# Patient Record
Sex: Female | Born: 1975 | Hispanic: Yes | Marital: Married | Smoking: Never smoker
Health system: Southern US, Community
[De-identification: ages and names within clinical notes are randomized; demographics above are authoritative.]

## PROBLEM LIST (undated history)

## (undated) DIAGNOSIS — N946 Dysmenorrhea, unspecified: Secondary | ICD-10-CM

## (undated) DIAGNOSIS — R51 Headache: Secondary | ICD-10-CM

## (undated) DIAGNOSIS — B159 Hepatitis A without hepatic coma: Secondary | ICD-10-CM

## (undated) DIAGNOSIS — M5412 Radiculopathy, cervical region: Secondary | ICD-10-CM

## (undated) DIAGNOSIS — B019 Varicella without complication: Secondary | ICD-10-CM

## (undated) DIAGNOSIS — K5900 Constipation, unspecified: Secondary | ICD-10-CM

## (undated) DIAGNOSIS — M67441 Ganglion, right hand: Secondary | ICD-10-CM

## (undated) DIAGNOSIS — R768 Other specified abnormal immunological findings in serum: Secondary | ICD-10-CM

## (undated) DIAGNOSIS — N809 Endometriosis, unspecified: Secondary | ICD-10-CM

## (undated) DIAGNOSIS — E559 Vitamin D deficiency, unspecified: Secondary | ICD-10-CM

## (undated) DIAGNOSIS — R519 Headache, unspecified: Secondary | ICD-10-CM

## (undated) DIAGNOSIS — E785 Hyperlipidemia, unspecified: Secondary | ICD-10-CM

## (undated) DIAGNOSIS — M47812 Spondylosis without myelopathy or radiculopathy, cervical region: Secondary | ICD-10-CM

## (undated) DIAGNOSIS — M722 Plantar fascial fibromatosis: Secondary | ICD-10-CM

## (undated) DIAGNOSIS — I959 Hypotension, unspecified: Secondary | ICD-10-CM

## (undated) DIAGNOSIS — R590 Localized enlarged lymph nodes: Secondary | ICD-10-CM

## (undated) DIAGNOSIS — E539 Vitamin B deficiency, unspecified: Secondary | ICD-10-CM

## (undated) HISTORY — DX: Hypotension, unspecified: I95.9

## (undated) HISTORY — DX: Vitamin D deficiency, unspecified: E55.9

## (undated) HISTORY — DX: Varicella without complication: B01.9

## (undated) HISTORY — DX: Radiculopathy, cervical region: M54.12

## (undated) HISTORY — DX: Hyperlipidemia, unspecified: E78.5

## (undated) HISTORY — DX: Headache, unspecified: R51.9

## (undated) HISTORY — DX: Endometriosis, unspecified: N80.9

## (undated) HISTORY — DX: Vitamin B deficiency, unspecified: E53.9

## (undated) HISTORY — DX: Dysmenorrhea, unspecified: N94.6

## (undated) HISTORY — DX: Hepatitis a without hepatic coma: B15.9

## (undated) HISTORY — DX: Plantar fascial fibromatosis: M72.2

## (undated) HISTORY — DX: Localized enlarged lymph nodes: R59.0

## (undated) HISTORY — DX: Ganglion, right hand: M67.441

## (undated) HISTORY — PX: DILATION AND CURETTAGE OF UTERUS: SHX78

## (undated) HISTORY — DX: Other specified abnormal immunological findings in serum: R76.8

## (undated) HISTORY — DX: Headache: R51

## (undated) HISTORY — DX: Spondylosis without myelopathy or radiculopathy, cervical region: M47.812

## (undated) HISTORY — DX: Constipation, unspecified: K59.00

---

## 2004-07-04 HISTORY — PX: TUBAL LIGATION: SHX77

## 2006-05-04 HISTORY — PX: ENDOMETRIAL ABLATION: SHX621

## 2006-09-22 LAB — HIV ANTIBODY (ROUTINE TESTING W REFLEX): HIV 1&2 Ab, 4th Generation: NEGATIVE

## 2014-11-22 DIAGNOSIS — M67441 Ganglion, right hand: Secondary | ICD-10-CM

## 2014-11-22 HISTORY — DX: Ganglion, right hand: M67.441

## 2014-12-02 LAB — HM MAMMOGRAPHY

## 2015-03-02 DIAGNOSIS — R768 Other specified abnormal immunological findings in serum: Secondary | ICD-10-CM

## 2015-03-02 HISTORY — DX: Other specified abnormal immunological findings in serum: R76.8

## 2015-03-02 LAB — HEMOGLOBIN A1C: Hemoglobin A1C: 4.8

## 2015-03-02 LAB — BASIC METABOLIC PANEL
BUN: 13 (ref 4–21)
Creatinine: 0.8 (ref 0.5–1.1)
GLUCOSE: 82
POTASSIUM: 4.3 (ref 3.4–5.3)
SODIUM: 137 (ref 137–147)

## 2015-03-02 LAB — TSH: TSH: 2.3 (ref 0.41–5.90)

## 2015-03-02 LAB — VITAMIN B12: Vitamin B-12: 219

## 2015-03-02 LAB — CBC AND DIFFERENTIAL
HCT: 44 (ref 36–46)
Hemoglobin: 14.8 (ref 12.0–16.0)
Neutrophils Absolute: 5
Platelets: 237 (ref 150–399)
WBC: 7.8

## 2015-03-02 LAB — VITAMIN D 25 HYDROXY (VIT D DEFICIENCY, FRACTURES): VIT D 25 HYDROXY: 23

## 2015-07-05 DIAGNOSIS — M5412 Radiculopathy, cervical region: Secondary | ICD-10-CM

## 2015-07-05 HISTORY — DX: Radiculopathy, cervical region: M54.12

## 2015-09-08 DIAGNOSIS — E539 Vitamin B deficiency, unspecified: Secondary | ICD-10-CM

## 2015-09-08 DIAGNOSIS — E559 Vitamin D deficiency, unspecified: Secondary | ICD-10-CM

## 2015-09-08 HISTORY — DX: Vitamin D deficiency, unspecified: E55.9

## 2015-09-08 HISTORY — DX: Vitamin B deficiency, unspecified: E53.9

## 2015-09-18 DIAGNOSIS — R590 Localized enlarged lymph nodes: Secondary | ICD-10-CM

## 2015-09-18 HISTORY — DX: Localized enlarged lymph nodes: R59.0

## 2015-11-28 LAB — HM MAMMOGRAPHY

## 2016-01-18 HISTORY — PX: ESOPHAGOGASTRODUODENOSCOPY: SHX1529

## 2016-12-03 LAB — HM MAMMOGRAPHY

## 2016-12-19 DIAGNOSIS — M47812 Spondylosis without myelopathy or radiculopathy, cervical region: Secondary | ICD-10-CM

## 2016-12-19 HISTORY — DX: Spondylosis without myelopathy or radiculopathy, cervical region: M47.812

## 2017-12-19 ENCOUNTER — Encounter: Payer: Self-pay | Admitting: *Deleted

## 2017-12-20 ENCOUNTER — Ambulatory Visit: Payer: Self-pay | Admitting: Family Medicine

## 2017-12-26 ENCOUNTER — Encounter: Payer: Self-pay | Admitting: *Deleted

## 2017-12-26 ENCOUNTER — Other Ambulatory Visit (HOSPITAL_COMMUNITY)
Admission: RE | Admit: 2017-12-26 | Discharge: 2017-12-26 | Disposition: A | Discharge status: Home / Self Care | Payer: Managed Care, Other (non HMO) | Source: Ambulatory Visit | Attending: Family Medicine | Admitting: Family Medicine

## 2017-12-26 ENCOUNTER — Ambulatory Visit: Payer: Managed Care, Other (non HMO) | Admitting: Family Medicine

## 2017-12-26 VITALS — BP 103/70 | HR 71 | Temp 98.2°F | Resp 18 | Ht 59.75 in | Wt 118.0 lb

## 2017-12-26 DIAGNOSIS — Z1239 Encounter for other screening for malignant neoplasm of breast: Secondary | ICD-10-CM

## 2017-12-26 DIAGNOSIS — Z131 Encounter for screening for diabetes mellitus: Secondary | ICD-10-CM

## 2017-12-26 DIAGNOSIS — Z13220 Encounter for screening for lipoid disorders: Secondary | ICD-10-CM | POA: Diagnosis not present

## 2017-12-26 DIAGNOSIS — Z01419 Encounter for gynecological examination (general) (routine) without abnormal findings: Secondary | ICD-10-CM

## 2017-12-26 DIAGNOSIS — Z130 Encounter for screening for diseases of the blood and blood-forming organs and certain disorders involving the immune mechanism: Secondary | ICD-10-CM

## 2017-12-26 DIAGNOSIS — N946 Dysmenorrhea, unspecified: Secondary | ICD-10-CM

## 2017-12-26 DIAGNOSIS — Z0000 Encounter for general adult medical examination without abnormal findings: Secondary | ICD-10-CM | POA: Diagnosis not present

## 2017-12-26 DIAGNOSIS — Z1231 Encounter for screening mammogram for malignant neoplasm of breast: Secondary | ICD-10-CM

## 2017-12-26 DIAGNOSIS — Z7689 Persons encountering health services in other specified circumstances: Secondary | ICD-10-CM

## 2017-12-26 NOTE — Progress Notes (Signed)
Patient ID: Kelly Duncan, female  DOB: Aug 05, 1975, 42 y.o.   MRN: [id number] Patient Care Team    Relationship Specialty Notifications Start End  Ma Hillock, DO PCP - General Family Medicine  12/26/17     Chief Complaint  Patient presents with  . Establish Care  . Annual Exam  . Gynecologic Exam    Subjective:  Kelly Duncan is a 42 y.o.  female present for new patient establishment. All past medical history, surgical history, allergies, family history, immunizations, medications and social history were updated in the electronic medical record today. All recent labs, ED visits and hospitalizations within the last year were reviewed.  Patient reports her menstrual cycles occur monthly, sometimes lasting 2 days, sometimes lasting 4 days.  He has had a significant history of endometriosis with dysmenorrhea.  She has had a endometrial ablation in the past.  Bleeding is  Light.  Health maintenance:  Colonoscopy: No fhx, screen at 49. Mammogram: 12/03/2016 birads 1. Cousin/aunt on her father's side with breast CA. Mammogram ordered today.  Cervical cancer screening: last pap: 11/2014, results: pt reports normal, no abnl pap per patient. Completed in [place].  Immunizations: tdap 12/2016 UTD, Influenza  (encouraged yearly) Infectious disease screening: HIV completed DEXA: routine screen Assistive device: none Oxygen SMO:LMBE Patient has a Dental home. Hospitalizations/ED visits:Reviewed  Depression screen [facility] 2/9 12/26/2017  Decreased Interest 0  Down, Depressed, Hopeless 0  PHQ - 2 Score 0   No flowsheet data found.  Current Exercise Habits: The patient does not participate in regular exercise at present Exercise limited by: None identified No flowsheet data found.   Immunization History  Administered Date(s) Administered  . Influenza-Unspecified 04/18/2013  . Tdap 12/03/2016    No exam data present  Past Medical History:  Diagnosis Date  . Cervical  radiculopathy at C7 2017  . Chicken pox   . Constipation   . Dysmenorrhea   . Endometriosis   . Frequent headaches   . Ganglion cyst of finger of right hand 11/22/2014  . Hepatitis A    childhood  . Left cervical lymphadenopathy 09/18/2015   benign appearing [place]  . Plantar fasciitis   . Positive ANA (antinuclear antibody) 03/02/2015   Negative anti--DNA screen, negative intrinsic factor antibody, normal sed rate, normal CRP, normal SCL-70 antibody, normal Jo 1 antibody, centromere antibody, normal histone antibody  . 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  Marland Kitchen Vitamin B deficiency 09/08/2015  . Vitamin D deficiency 09/08/2015   No Known Allergies Past Surgical History:  Procedure Laterality Date  . DILATION AND CURETTAGE OF UTERUS    . ENDOMETRIAL ABLATION  05/2006  . ESOPHAGOGASTRODUODENOSCOPY  01/18/2016   Out esophagitis.  Meprazole 20 mg daily recommended.  . TUBAL LIGATION  2006   Family History  Problem Relation Age of Onset  . Hypertension Mother   . Hyperlipidemia Mother   . Thyroid disease Mother   . Arthritis Mother   . Diabetes Mother   . Hyperlipidemia Father   . Prostate cancer Father   . Hearing loss Father   . Hypertension Father   . Heart disease Father   . Hyperlipidemia Sister   . Diabetes Sister   . Arthritis Maternal Grandmother   . Diabetes Maternal Grandmother   . Hyperlipidemia Maternal Grandmother   . Hypertension Maternal Grandmother   . Stroke Maternal Grandmother   . Diabetes Maternal Grandfather   .  Hyperlipidemia Sister   . Breast cancer Cousin   . Breast cancer Paternal Aunt    Social History   Socioeconomic History  . Marital status: Married    Spouse name: Not on file  . Number of children: Not on file  . Years of education: Not on file  . Highest education level: Not on file  Occupational History  . Not on file  Social Needs  . Financial resource  strain: Not on file  . Food insecurity:    Worry: Not on file    Inability: Not on file  . Transportation needs:    Medical: Not on file    Non-medical: Not on file  Tobacco Use  . Smoking status: Never Smoker  . Smokeless tobacco: Never Used  Substance and Sexual Activity  . Alcohol use: Never    Frequency: Never  . Drug use: Never  . Sexual activity: Yes  Lifestyle  . Physical activity:    Days per week: Not on file    Minutes per session: Not on file  . Stress: Not on file  Relationships  . Social connections:    Talks on phone: Not on file    Gets together: Not on file    Attends religious service: Not on file    Active member of club or organization: Not on file    Attends meetings of clubs or organizations: Not on file    Relationship status: Not on file  . Intimate partner violence:    Fear of current or ex partner: Not on file    Emotionally abused: Not on file    Physically abused: Not on file    Forced sexual activity: Not on file  Other Topics Concern  . Not on file  Social History Narrative  . Not on file   Allergies as of 12/26/2017   No Known Allergies     Medication List    as of 12/26/2017 11:59 PM   You have not been prescribed any medications.     All past medical history, surgical history, allergies, family history, immunizations andmedications were updated in the EMR today and reviewed under the history and medication portions of their EMR.    Recent Results (from the past 2160 hour(s))  CBC w/Diff     Status: None   Collection Time: 12/26/17  3:51 PM  Result Value Ref Range   WBC 8.0 3.8 - 10.8 Thousand/uL   RBC 4.17 3.80 - 5.10 Million/uL   Hemoglobin 13.1 11.7 - 15.5 g/dL   HCT 38.7 35.0 - 45.0 %   MCV 92.8 80.0 - 100.0 fL   MCH 31.4 27.0 - 33.0 pg   MCHC 33.9 32.0 - 36.0 g/dL   RDW 12.0 11.0 - 15.0 %   Platelets 234 140 - 400 Thousand/uL   MPV 10.6 7.5 - 12.5 fL   Neutro Abs 4,400 1,500 - 7,800 cells/uL   Lymphs Abs 2,864 850 -  3,900 cells/uL   WBC mixed population 544 200 - 950 cells/uL   Eosinophils Absolute 168 15 - 500 cells/uL   Basophils Absolute 24 0 - 200 cells/uL   Neutrophils Relative % 55 %   Total Lymphocyte 35.8 %   Monocytes Relative 6.8 %   Eosinophils Relative 2.1 %   Basophils Relative 0.3 %  Comp Met (CMET)     Status: None   Collection Time: 12/26/17  3:51 PM  Result Value Ref Range   Glucose, Bld 65 65 - 99 mg/dL  Comment: .            Fasting reference interval .    BUN 21 7 - 25 mg/dL   Creat 0.70 0.50 - 1.10 mg/dL   BUN/Creatinine Ratio NOT APPLICABLE 6 - 22 (calc)   Sodium 138 135 - 146 mmol/L   Potassium 4.1 3.5 - 5.3 mmol/L   Chloride 106 98 - 110 mmol/L   CO2 27 20 - 32 mmol/L   Calcium 9.3 8.6 - 10.2 mg/dL   Total Protein 6.9 6.1 - 8.1 g/dL   Albumin 4.2 3.6 - 5.1 g/dL   Globulin 2.7 1.9 - 3.7 g/dL (calc)   AG Ratio 1.6 1.0 - 2.5 (calc)   Total Bilirubin 0.3 0.2 - 1.2 mg/dL   Alkaline phosphatase (APISO) 58 33 - 115 U/L   AST 14 10 - 30 U/L   ALT 12 6 - 29 U/L  Lipid panel     Status: Abnormal   Collection Time: 12/26/17  3:51 PM  Result Value Ref Range   Cholesterol 182 <200 mg/dL   HDL 41 (L) >50 mg/dL   Triglycerides 268 (H) <150 mg/dL   LDL Cholesterol (Calc) 103 (H) mg/dL (calc)    Comment: Reference range: <100 . Desirable range <100 mg/dL for primary prevention;   <70 mg/dL for patients with CHD or diabetic patients  with > or = 2 CHD risk factors. Marland Kitchen LDL-C is now calculated using the Martin-Hopkins  calculation, which is a validated novel method providing  better accuracy than the Friedewald equation in the  estimation of LDL-C.  Cresenciano Genre et al. Annamaria Helling. 9628;366(29): 2061-2068  (http://education.[facility].com/faq/FAQ164)    Total CHOL/HDL Ratio 4.4 <5.0 (calc)   Non-HDL Cholesterol (Calc) 141 (H) <130 mg/dL (calc)    Comment: For patients with diabetes plus 1 major ASCVD risk  factor, treating to a non-HDL-C goal of <100 mg/dL  (LDL-C of <70  mg/dL) is considered a therapeutic  option.   HgB A1c     Status: None   Collection Time: 12/26/17  3:51 PM  Result Value Ref Range   Hgb A1c MFr Bld 4.7 <5.7 % of total Hgb    Comment: For the purpose of screening for the presence of diabetes: . <5.7%       Consistent with the absence of diabetes 5.7-6.4%    Consistent with increased risk for diabetes             (prediabetes) > or =6.5%  Consistent with diabetes . This assay result is consistent with a decreased risk of diabetes. . Currently, no consensus exists regarding use of hemoglobin A1c for diagnosis of diabetes in children. . According to American Diabetes Association (ADA) guidelines, hemoglobin A1c <7.0% represents optimal control in non-pregnant diabetic patients. Different metrics may apply to specific patient populations.  Standards of Medical Care in Diabetes(ADA). .    Mean Plasma Glucose 88 (calc)   eAG (mmol/L) 4.9 (calc)  TSH     Status: None   Collection Time: 12/26/17  3:51 PM  Result Value Ref Range   TSH 1.00 mIU/L    Comment:           Reference Range .           > or = 20 Years  0.40-4.50 .                Pregnancy Ranges           First trimester    0.26-2.66  Second trimester   0.55-2.73           Third trimester    0.43-2.91     Patient was never admitted.   ROS: 14 pt review of systems performed and negative (unless mentioned in an HPI)  Objective: BP 103/70 (BP Location: Left Arm, Patient Position: Sitting, Cuff Size: Normal)   Pulse 71   Temp 98.2 F (36.8 C)   Resp 18   Ht 4' 11.75" (1.518 m)   Wt 118 lb (53.5 kg)   LMP 11/30/2017   SpO2 100%   BMI 23.24 kg/m  Gen: Afebrile. No acute distress. Nontoxic in appearance, well-developed, well-nourished, pleasant female. HENT: AT. . Bilateral TM visualized and normal in appearance, normal external auditory canal. MMM, no oral lesions, adequate dentition. Bilateral nares within normal limits. Throat without erythema,  ulcerations or exudates.  No cough on exam, no hoarseness on exam. Eyes:Pupils Equal Round Reactive to light, Extraocular movements intact,  Conjunctiva without redness, discharge or icterus. Neck/lymp/endocrine: Supple, no lymphadenopathy, no thyromegaly CV: RRR no murmur, no edema, +2/4 P posterior tibialis pulses.  No carotid bruits. No JVD. Chest: CTAB, no wheeze, rhonchi or crackles.  Normal respiratory effort.  Good air movement. Abd: Soft.  Flat. NTND. BS present.  No masses palpated. No hepatosplenomegaly. No rebound tenderness or guarding. Skin: No rashes, purpura or petechiae. Warm and well-perfused. Skin intact. Neuro/Msk:  Normal gait. PERLA. EOMi. Alert. Oriented x3.  Cranial nerves II through XII intact. Muscle strength 5/5 upper/lower extremity. DTRs equal bilaterally. Psych: Normal affect, dress and demeanor. Normal speech. Normal thought content and judgment.   Assessment/plan: Kelly Duncan is a 42 y.o. female present for CPE/PAP. Dysmenorrhea - better since ablation, but still present.  - CBC w/Diff - Comp Met (CMET) - TSH Diabetes mellitus screening - HgB A1c Screening cholesterol level - Lipid panel Screening for deficiency anemia - CBC w/Dif Breast cancer screening - MM 3D SCREEN BREAST BILATERAL; Future Encounter for routine gynecological examination with Papanicolaou smear of cervix - Cytology - PAP Encounter for preventive health examination Patient was encouraged to exercise greater than 150 minutes a week. Patient was encouraged to choose a diet filled with fresh fruits and vegetables, and lean meats. AVS provided to patient today for education/recommendation on gender specific health and safety maintenance. Colonoscopy: No fhx, screen at 49. Mammogram: 12/03/2016 birads 1. Cousin/aunt on her father's side with breast CA. Mammogram ordered today.  Cervical cancer screening: last pap: 11/2014, results: pt reports normal, no abnl pap per patient. Completed  in [place].  Immunizations: tdap 12/2016 UTD, Influenza  (encouraged yearly) Infectious disease screening: HIV completed DEXA: routine screen   Return in about 1 year (around 12/27/2018) for CPE.   Note is dictated utilizing voice recognition software. Although note has been proof read prior to signing, occasional typographical errors still can be missed. If any questions arise, please do not hesitate to call for verification.  Electronically signed by: Howard Pouch, DO [place]

## 2017-12-26 NOTE — Patient Instructions (Signed)

## 2017-12-27 ENCOUNTER — Encounter: Payer: Self-pay | Admitting: *Deleted

## 2017-12-27 ENCOUNTER — Telehealth: Payer: Self-pay | Admitting: Family Medicine

## 2017-12-27 ENCOUNTER — Encounter: Payer: Self-pay | Admitting: Family Medicine

## 2017-12-27 LAB — CBC WITH DIFFERENTIAL/PLATELET
BASOS PCT: 0.3 %
Basophils Absolute: 24 cells/uL (ref 0–200)
EOS PCT: 2.1 %
Eosinophils Absolute: 168 cells/uL (ref 15–500)
HCT: 38.7 % (ref 35.0–45.0)
Hemoglobin: 13.1 g/dL (ref 11.7–15.5)
LYMPHS ABS: 2864 {cells}/uL (ref 850–3900)
MCH: 31.4 pg (ref 27.0–33.0)
MCHC: 33.9 g/dL (ref 32.0–36.0)
MCV: 92.8 fL (ref 80.0–100.0)
MPV: 10.6 fL (ref 7.5–12.5)
Monocytes Relative: 6.8 %
NEUTROS ABS: 4400 {cells}/uL (ref 1500–7800)
Neutrophils Relative %: 55 %
PLATELETS: 234 10*3/uL (ref 140–400)
RBC: 4.17 10*6/uL (ref 3.80–5.10)
RDW: 12 % (ref 11.0–15.0)
TOTAL LYMPHOCYTE: 35.8 %
WBC: 8 10*3/uL (ref 3.8–10.8)
WBCMIX: 544 {cells}/uL (ref 200–950)

## 2017-12-27 LAB — COMPREHENSIVE METABOLIC PANEL
AG Ratio: 1.6 (calc) (ref 1.0–2.5)
ALBUMIN MSPROF: 4.2 g/dL (ref 3.6–5.1)
ALKALINE PHOSPHATASE (APISO): 58 U/L (ref 33–115)
ALT: 12 U/L (ref 6–29)
AST: 14 U/L (ref 10–30)
BUN: 21 mg/dL (ref 7–25)
CHLORIDE: 106 mmol/L (ref 98–110)
CO2: 27 mmol/L (ref 20–32)
CREATININE: 0.7 mg/dL (ref 0.50–1.10)
Calcium: 9.3 mg/dL (ref 8.6–10.2)
GLOBULIN: 2.7 g/dL (ref 1.9–3.7)
Glucose, Bld: 65 mg/dL (ref 65–99)
POTASSIUM: 4.1 mmol/L (ref 3.5–5.3)
SODIUM: 138 mmol/L (ref 135–146)
Total Bilirubin: 0.3 mg/dL (ref 0.2–1.2)
Total Protein: 6.9 g/dL (ref 6.1–8.1)

## 2017-12-27 LAB — HEMOGLOBIN A1C
EAG (MMOL/L): 4.9 (calc)
Hgb A1c MFr Bld: 4.7 % of total Hgb (ref <5.7)
MEAN PLASMA GLUCOSE: 88 (calc)

## 2017-12-27 LAB — TSH: TSH: 1 mIU/L

## 2017-12-27 LAB — LIPID PANEL
CHOL/HDL RATIO: 4.4 (calc) (ref <5.0)
CHOLESTEROL: 182 mg/dL (ref <200)
HDL: 41 mg/dL — ABNORMAL LOW (ref >50)
LDL CHOLESTEROL (CALC): 103 mg/dL — AB
Non-HDL Cholesterol (Calc): 141 mg/dL (calc) — ABNORMAL HIGH (ref <130)
Triglycerides: 268 mg/dL — ABNORMAL HIGH (ref <150)

## 2017-12-27 NOTE — Telephone Encounter (Addendum)
Please inform patient the following information: Her labs are all normal except her triglycerides were mildly elevated.  However she was not fasting.  Exercise greater than 150 minutes a week.  She can also add fish oil supplementation to her daily routine.  [phone] mg daily. We will call her when receive her PAP results which may be a few more days

## 2017-12-28 NOTE — Telephone Encounter (Signed)
Left detailed message with results and instructions on patient voice mail per DPR 

## 2017-12-29 ENCOUNTER — Telehealth: Payer: Self-pay | Admitting: Family Medicine

## 2017-12-29 LAB — CYTOLOGY - PAP
DIAGNOSIS: NEGATIVE
HPV (WINDOPATH): NOT DETECTED

## 2017-12-29 NOTE — Telephone Encounter (Signed)
Left detailed message on pt's cell phone, okay per DPR.

## 2017-12-29 NOTE — Telephone Encounter (Signed)
Pap smear was normal and neg co testing.  Next pap due 3 years  F/u yearly with CPE

## 2017-12-29 NOTE — Telephone Encounter (Signed)
LM for patient that disc with mammo results have been received and are available for patient to pick up at the front desk [place] [facility] office. The disc will need to be given to the technician at [facility].

## 2018-01-01 ENCOUNTER — Encounter: Payer: Self-pay | Admitting: Family Medicine

## 2018-01-01 ENCOUNTER — Encounter: Payer: Self-pay | Admitting: *Deleted

## 2018-01-10 ENCOUNTER — Ambulatory Visit: Payer: Managed Care, Other (non HMO) | Admitting: Family Medicine

## 2018-01-11 ENCOUNTER — Ambulatory Visit: Payer: Managed Care, Other (non HMO) | Admitting: Family Medicine

## 2018-02-21 ENCOUNTER — Ambulatory Visit: Payer: Managed Care, Other (non HMO)

## 2018-03-14 ENCOUNTER — Ambulatory Visit
Admission: RE | Admit: 2018-03-14 | Discharge: 2018-03-14 | Disposition: A | Discharge status: Home / Self Care | Payer: Managed Care, Other (non HMO) | Source: Ambulatory Visit | Attending: Family Medicine | Admitting: Family Medicine

## 2018-03-14 DIAGNOSIS — Z1239 Encounter for other screening for malignant neoplasm of breast: Secondary | ICD-10-CM

## 2018-12-28 ENCOUNTER — Encounter: Payer: Managed Care, Other (non HMO) | Admitting: Family Medicine

## 2019-01-14 ENCOUNTER — Encounter: Payer: Self-pay | Admitting: Family Medicine

## 2019-01-16 ENCOUNTER — Ambulatory Visit (INDEPENDENT_AMBULATORY_CARE_PROVIDER_SITE_OTHER): Payer: 59 | Admitting: Family Medicine

## 2019-01-16 ENCOUNTER — Other Ambulatory Visit: Payer: Self-pay

## 2019-01-16 ENCOUNTER — Encounter: Payer: Self-pay | Admitting: Family Medicine

## 2019-01-16 VITALS — Temp 98.0°F

## 2019-01-16 DIAGNOSIS — M659 Synovitis and tenosynovitis, unspecified: Secondary | ICD-10-CM | POA: Diagnosis not present

## 2019-01-16 MED ORDER — PREDNISONE 20 MG PO TABS: ORAL_TABLET | ORAL | 0 refills | Status: DC

## 2019-01-16 NOTE — Patient Instructions (Signed)
Tenosynovitis  Tenosynovitis is inflammation of a tendon and of the sleeve of tissue that covers the tendon (tendon sheath). A tendon is a cord of tissue that connects muscle to bone. Normally, a tendon slides smoothly inside its tendon sheath. Tenosynovitis limits movement of the tendon and surrounding tissues, which may cause pain and stiffness. Tenosynovitis can affect any tendon and tendon sheath. Commonly affected areas include tendons in the:  Wrist.  Arm.  Hand.  Hip.  Leg.  Foot.  Shoulder. What are the causes? The main cause of this condition is wear and tear over time that results in slight tears in the tendon. Other possible causes include:  An injury to the tendon or tendon sheath.  A disease that causes inflammation in the body.  An infection that spreads to the tendon and tendon sheath from a skin wound.  An infection in another part of the body that spreads to the tendon and tendon sheath through the blood. What increases the risk? The following factors may make you more likely to develop this condition:  Having rheumatoid arthritis, gout, or diabetes.  Using IV drugs.  Doing physical activities that can cause tendon overuse and stress.  Having gonorrhea. What are the signs or symptoms? Symptoms of this condition depend on the cause. Symptoms may include:  Pain with movement.  Pain when pressing on the tendon and tendon sheath.  Swelling.  Stiffness. If tenosynovitis is caused by an infection, symptoms may include:  Fever.  Redness.  Warmth. How is this diagnosed? This condition may be diagnosed based on your medical history and a physical exam. You also may have:  Blood tests.  Imaging tests, such as: ? MRI. ? Ultrasound.  A sample of fluid removed from inside the tendon sheath to be checked in a lab. How is this treated? Treatment for this condition depends on the cause. If tenosynovitis is not caused by an infection, treatment may  include:  Rest.  Keeping the tendon in place (immobilization) in a splint, brace, or sling.  Taking NSAIDs to reduce pain and swelling.  A shot (injection) of medicine to help reduce pain and swelling (steroid).  Icing or applying heat to the affected area.  Physical therapy.  Surgery to release the tendon in the sheath or to repair damage to the tendon or tendon sheath. Surgery may be done if other treatments do not help relieve symptoms. If tenosynovitis is caused by infection, treatment may include antibiotic medicine given through an IV. In some cases, surgery may be needed to drain fluid from the tendon sheath or to remove the tendon sheath. Follow these instructions at home: If you have a splint, brace, or sling:   Wear the splint, brace, or sling as told by your health care provider. Remove it only as told by your health care provider.  Loosen the splint, brace, or sling if your fingers or toes tingle, become numb, or turn cold and blue.  Keep the splint, brace, or sling clean.  If the splint, brace, or sling is not waterproof: ? Do not let it get wet. ? Cover it with a watertight covering when you take a bath or shower. Managing pain, stiffness, and swelling   If directed, put ice on the affected area. ? Put ice in a plastic bag. ? Place a towel between your skin and the bag. ? Leave the ice on for 20 minutes, 2-3 times a day.  Move the fingers or toes of the affected limb often, if  this applies. This can help to reduce stiffness and swelling.  If directed, raise (elevate) the affected area above the level of your heart while you are sitting or lying down.  If directed, apply heat to the affected area before you exercise. Use the heat source that your health care provider recommends, such as a moist heat pack or a heating pad. ? Place a towel between your skin and the heat source. ? Leave the heat on for 20-30 minutes. ? Remove the heat if your skin turns bright  red. This is especially important if you are unable to feel pain, heat, or cold. You may have a greater risk of getting burned. Medicines  Take over-the-counter and prescription medicines only as told by your health care provider.  Ask your health care provider if the medicine prescribed to you: ? Requires you to avoid driving or using heavy machinery. ? Can cause constipation. You may need to take actions to prevent or treat constipation, such as:  Drink enough fluid to keep your urine pale yellow.  Take over-the-counter or prescription medicines.  Eat foods that are high in fiber, such as beans, whole grains, and fresh fruits and vegetables.  Limit foods that are high in fat and processed sugars, such as fried or sweet foods. Activity  Return to your normal activities as told by your health care provider. Ask your health care provider what activities are safe for you.  Rest the affected area as told by your health care provider.  Avoid using the affected area while you are having symptoms.  Do not use the injured limb to support your body weight until your health care provider says that you can.  If physical therapy was prescribed, do exercises as told by your health care provider. General instructions  Ask your health care provider when it is safe to drive if you have a splint or brace on any part of your arm or leg.  Keep all follow-up visits as told by your health care provider. This is important. Contact a health care provider if:  Your symptoms are not improving or are getting worse. Get help right away if:  Your fingers or toes become numb or turn blue.  You have a fever and more of any of the following symptoms: ? Pain. ? Redness. ? Warmth. ? Swelling. Summary  Tenosynovitis is inflammation of a tendon and of the sleeve of tissue that covers the tendon (tendon sheath).  Treatment for this condition depends on the cause. Treatment may include rest, medicines,  physical therapy, or surgery.  Contact a health care provider if your symptoms are not improving or are getting worse.  Keep all follow-up visits as told by your health care provider. This is important. This information is not intended to replace advice given to you by your health care provider. Make sure you discuss any questions you have with your health care provider. Document Released: 06/20/2005 Document Revised: 02/08/2018 Document Reviewed: 02/08/2018 Elsevier Patient Education  2020 [employer].

## 2019-01-16 NOTE — Progress Notes (Signed)
   VIRTUAL VISIT VIA VIDEO  I connected with Kelly Duncan on 01/16/19 at  3:00 PM EDT by a video enabled telemedicine application and verified that I am speaking with the correct person using two identifiers. Location patient: Home Location provider: [facility], Office Persons participating in the virtual visit: Patient, Dr. Raoul Pitch and R.Baker, LPN  I discussed the limitations of evaluation and management by telemedicine and the availability of in person appointments. The patient expressed understanding and agreed to proceed.   SUBJECTIVE Chief Complaint  Patient presents with  . Hand Pain    right hand. swelling, no warmth, pain. x1wk. not injury related. pt can bend and move her hand. she cant open or turn door knobs without pain. pain is radiating to arm and shoulder. taking advil for the pain.    HPI: Kelly Duncan is a 43 y.o. female present for right hand pain that started approximately 1 week ago.  She denies any injury or change in activity.  She states the pain is worse when turning her hand and wrist such as in turning a doorknob.  She states when she does that the pain will shoot up her arm.  She denies any fever, chills or redness.  She has been taking Advil 800 mg every 8 hours.  She reports the pain is worse surrounding her index and thumb.  She reports a history of cervical spine narrowing and does not know if this has anything to do with it.  ROS: See pertinent positives and negatives per HPI.  Patient Active Problem List   Diagnosis Date Noted  . Encounter for routine gynecological examination with Papanicolaou smear of cervix 12/26/2017  . Dysmenorrhea     Social History   Tobacco Use  . Smoking status: Never Smoker  . Smokeless tobacco: Never Used  Substance Use Topics  . Alcohol use: Never    Frequency: Never   No current outpatient medications on file.  No Known Allergies  OBJECTIVE: Temp 98 F (36.7 C) (Oral)   LMP 12/29/2018 (Exact Date)   Gen: No acute distress. Nontoxic in appearance.  HENT: AT. [place].  MMM.  Eyes:Pupils Equal Round Reactive to light, Extraocular movements intact,  Conjunctiva without redness, discharge or icterus. Chest: Cough or shortness of breath not present Skin: No rashes, purpura or petechiae.  Neuro/MSK:  Normal gait. Alert. Oriented x3.  Right hand without erythema, no soft tissue swelling present today.  Full range of motion of wrist and fingers.  Positive Tinel's at wrist.  Negative Finkelstein.  Pain with thumb to finger from 3rd-5th finger.  Neurovascularly intact distally. Psych: Normal affect, dress and demeanor. Normal speech. Normal thought content and judgment.  ASSESSMENT AND PLAN: Kelly Duncan is a 43 y.o. female present for  Tenosynovitis -Patient with tenosynovitis symptoms as well as positive carpal tunnel signs. -Prednisone taper.  Encouraged her to take with food and if she has a stomach upset to start omeprazole since she is also taking high-dose NSAIDs. -Encouraged her to purchase a carpal tunnel night splint and wear every night. -After 1 week on medication, start very light stretches which were demonstrated for her today. She has a physical coming up at the end of the month and we will check back on this condition at that time- unless worsening she needs to be seen sooner.    > 25 minutes spent with patient, >50% of time spent face to face    Howard Pouch, DO 01/16/2019

## 2019-01-29 ENCOUNTER — Other Ambulatory Visit: Payer: Self-pay

## 2019-01-29 ENCOUNTER — Encounter: Payer: Self-pay | Admitting: Family Medicine

## 2019-01-29 ENCOUNTER — Ambulatory Visit (INDEPENDENT_AMBULATORY_CARE_PROVIDER_SITE_OTHER): Payer: 59 | Admitting: Family Medicine

## 2019-01-29 VITALS — BP 101/67 | HR 78 | Temp 98.7°F | Resp 17 | Ht 58.66 in | Wt 140.1 lb

## 2019-01-29 DIAGNOSIS — Z131 Encounter for screening for diabetes mellitus: Secondary | ICD-10-CM | POA: Diagnosis not present

## 2019-01-29 DIAGNOSIS — Z130 Encounter for screening for diseases of the blood and blood-forming organs and certain disorders involving the immune mechanism: Secondary | ICD-10-CM

## 2019-01-29 DIAGNOSIS — Z1239 Encounter for other screening for malignant neoplasm of breast: Secondary | ICD-10-CM

## 2019-01-29 DIAGNOSIS — E781 Pure hyperglyceridemia: Secondary | ICD-10-CM

## 2019-01-29 DIAGNOSIS — M654 Radial styloid tenosynovitis [de Quervain]: Secondary | ICD-10-CM

## 2019-01-29 DIAGNOSIS — E663 Overweight: Secondary | ICD-10-CM

## 2019-01-29 DIAGNOSIS — Z0000 Encounter for general adult medical examination without abnormal findings: Secondary | ICD-10-CM

## 2019-01-29 NOTE — Patient Instructions (Addendum)
Health Maintenance, Female Adopting a healthy lifestyle and getting preventive care are important in promoting health and wellness. Ask your health care provider about:  The right schedule for you to have regular tests and exams.  Things you can do on your own to prevent diseases and keep yourself healthy. What should I know about diet, weight, and exercise? Eat a healthy diet   Eat a diet that includes plenty of vegetables, fruits, low-fat dairy products, and lean protein.  Do not eat a lot of foods that are high in solid fats, added sugars, or sodium. Maintain a healthy weight Body mass index (BMI) is used to identify weight problems. It estimates body fat based on height and weight. Your health care provider can help determine your BMI and help you achieve or maintain a healthy weight. Get regular exercise Get regular exercise. This is one of the most important things you can do for your health. Most adults should:  Exercise for at least 150 minutes each week. The exercise should increase your heart rate and make you sweat (moderate-intensity exercise).  Do strengthening exercises at least twice a week. This is in addition to the moderate-intensity exercise.  Spend less time sitting. Even light physical activity can be beneficial. Watch cholesterol and blood lipids Have your blood tested for lipids and cholesterol at 44 years of age, then have this test every 5 years. Have your cholesterol levels checked more often if:  Your lipid or cholesterol levels are high.  You are older than 44 years of age.  You are at high risk for heart disease. What should I know about cancer screening? Depending on your health history and family history, you may need to have cancer screening at various ages. This may include screening for:  Breast cancer.  Cervical cancer.  Colorectal cancer.  Skin cancer.  Lung cancer. What should I know about heart disease, diabetes, and high blood  pressure? Blood pressure and heart disease  High blood pressure causes heart disease and increases the risk of stroke. This is more likely to develop in people who have high blood pressure readings, are of African descent, or are overweight.  Have your blood pressure checked: ? Every 3-5 years if you are 18-39 years of age. ? Every year if you are 40 years old or older. Diabetes Have regular diabetes screenings. This checks your fasting blood sugar level. Have the screening done:  Once every three years after age 40 if you are at a normal weight and have a low risk for diabetes.  More often and at a younger age if you are overweight or have a high risk for diabetes. What should I know about preventing infection? Hepatitis B If you have a higher risk for hepatitis B, you should be screened for this virus. Talk with your health care provider to find out if you are at risk for hepatitis B infection. Hepatitis C Testing is recommended for:  Everyone born from 1945 through 1965.  Anyone with known risk factors for hepatitis C. Sexually transmitted infections (STIs)  Get screened for STIs, including gonorrhea and chlamydia, if: ? You are sexually active and are younger than 44 years of age. ? You are older than 44 years of age and your health care provider tells you that you are at risk for this type of infection. ? Your sexual activity has changed since you were last screened, and you are at increased risk for chlamydia or gonorrhea. Ask your health care provider if   you are at risk.  Ask your health care provider about whether you are at high risk for HIV. Your health care provider may recommend a prescription medicine to help prevent HIV infection. If you choose to take medicine to prevent HIV, you should first get tested for HIV. You should then be tested every 3 months for as long as you are taking the medicine. Pregnancy  If you are about to stop having your period (premenopausal) and  you may become pregnant, seek counseling before you get pregnant.  Take 400 to 800 micrograms (mcg) of folic acid every day if you become pregnant.  Ask for birth control (contraception) if you want to prevent pregnancy. Osteoporosis and menopause Osteoporosis is a disease in which the bones lose minerals and strength with aging. This can result in bone fractures. If you are 65 years old or older, or if you are at risk for osteoporosis and fractures, ask your health care provider if you should:  Be screened for bone loss.  Take a calcium or vitamin D supplement to lower your risk of fractures.  Be given hormone replacement therapy (HRT) to treat symptoms of menopause. Follow these instructions at home: Lifestyle  Do not use any products that contain nicotine or tobacco, such as cigarettes, e-cigarettes, and chewing tobacco. If you need help quitting, ask your health care provider.  Do not use street drugs.  Do not share needles.  Ask your health care provider for help if you need support or information about quitting drugs. Alcohol use  Do not drink alcohol if: ? Your health care provider tells you not to drink. ? You are pregnant, may be pregnant, or are planning to become pregnant.  If you drink alcohol: ? Limit how much you use to 0-1 drink a day. ? Limit intake if you are breastfeeding.  Be aware of how much alcohol is in your drink. In the [place] one drink equals one 12 oz bottle of beer (355 mL), one 5 oz glass of wine (148 mL), or one 1 oz glass of hard liquor (44 mL). General instructions  Schedule regular health, dental, and eye exams.  Stay current with your vaccines.  Tell your health care provider if: ? You often feel depressed. ? You have ever been abused or do not feel safe at home. Summary  Adopting a healthy lifestyle and getting preventive care are important in promoting health and wellness.  Follow your health care provider's instructions about healthy  diet, exercising, and getting tested or screened for diseases.  Follow your health care provider's instructions on monitoring your cholesterol and blood pressure. This information is not intended to replace advice given to you by your health care provider. Make sure you discuss any questions you have with your health care provider. Document Released: 01/03/2011 Document Revised: 06/13/2018 Document Reviewed: 06/13/2018 Elsevier Patient Education  2020 Else[employer] Inc. De Quervain's Tenosynovitis  De Quervain's tenosynovitis is a condition that causes inflammation of the tendon on the thumb side of the wrist. Tendons are cords of tissue that connect bones to muscles. The tendons in the hand pass through a tunnel called a sheath. A slippery layer of tissue (synovium) lets the tendons move smoothly in the sheath. With de Quervain's tenosynovitis, the sheath swells or thickens, causing friction and pain. The condition is also called de Quervain's disease and de Quervain's syndrome. It occurs most often in women who are 30-50 years old. What are the causes? The exact cause of this condition is   not known. It may be associated with overuse of the hand and wrist. What increases the risk? You are more likely to develop this condition if you:  Use your hands far more than normal, especially if you repeat certain movements that involve twisting your hand or using a tight grip.  Are pregnant.  Are a middle-aged woman.  Have rheumatoid arthritis.  Have diabetes. What are the signs or symptoms? The main symptom of this condition is pain on the thumb side of the wrist. The pain may get worse when you grasp something or turn your wrist. Other symptoms may include:  Pain that extends up the forearm.  Swelling of your wrist and hand.  Trouble moving the thumb and wrist.  A sensation of snapping in the wrist.  A bump filled with fluid (cyst) in the area of the pain. How is this diagnosed? This  condition may be diagnosed based on:  Your symptoms and medical history.  A physical exam. During the exam, your health care provider may do a simple test (Finkelstein test) that involves pulling your thumb and wrist to see if this causes pain. You may also need to have an X-ray. How is this treated? Treatment for this condition may include:  Avoiding any activity that causes pain and swelling.  Taking medicines. Anti-inflammatory medicines and corticosteroid injections may be used to reduce inflammation and relieve pain.  Wearing a splint.  Having surgery. This may be needed if other treatments do not work. Once the pain and swelling has gone down:  Physical therapy. This includes stretching and strengthening exercises.  Occupational therapy. This includes adjusting how you move your wrist. Follow these instructions at home: If you have a splint:  Wear the splint as told by your health care provider. Remove it only as told by your health care provider.  Loosen the splint if your fingers tingle, become numb, or turn cold and blue.  Keep the splint clean.  If the splint is not waterproof: ? Do not let it get wet. ? Cover it with a watertight covering when you take a bath or a shower. Managing pain, stiffness, and swelling   Avoid movements and activities that cause pain and swelling in the wrist area.  If directed, put ice on the painful area. This may be helpful after doing activities that involve the sore wrist. ? Put ice in a plastic bag. ? Place a towel between your skin and the bag. ? Leave the ice on for 20 minutes, 2-3 times a day.  Move your fingers often to avoid stiffness and to lessen swelling.  Raise (elevate) the injured area above the level of your heart while you are sitting or lying down. General instructions  Return to your normal activities as told by your health care provider. Ask your health care provider what activities are safe for you.  Take  over-the-counter and prescription medicines only as told by your health care provider.  Keep all follow-up visits as told by your health care provider. This is important. Contact a health care provider if:  Your pain medicine does not help.  Your pain gets worse.  You develop new symptoms. Summary  De Quervain's tenosynovitis is a condition that causes inflammation of the tendon on the thumb side of the wrist.  The condition occurs most often in women who are 30-50 years old.  The exact cause of this condition is not known. It may be associated with overuse of the hand and wrist.    Treatment starts with avoiding activity that causes pain or swelling in the wrist area. Other treatment may include wearing a splint and taking medicine. Sometimes, surgery is needed. This information is not intended to replace advice given to you by your health care provider. Make sure you discuss any questions you have with your health care provider. Document Released: 03/15/2001 Document Revised: 12/21/2017 Document Reviewed: 05/29/2017 Elsevier Patient Education  2020 Elsevier Inc.  

## 2019-01-29 NOTE — Progress Notes (Signed)
Patient ID: Kelly Duncan, female  DOB: Aug 19, 1975, 43 y.o.   MRN: [id number] Patient Care Team    Relationship Specialty Notifications Start End  Ma Hillock, DO PCP - General Family Medicine  12/26/17     Chief Complaint  Patient presents with  . Annual Exam    not fasting. pap 2016 mammo 03/14/2018    Subjective:  Kelly Duncan is a 43 y.o.  Female  present for CPE. All past medical history, surgical history, allergies, family history, immunizations, medications and social history were updated in the electronic medical record today. All recent labs, ED visits and hospitalizations within the last year were reviewed.  Patient reports her menstrual cycles occur monthly, lasting 2 -4 days.  SHe has had a significant history of endometriosis with dysmenorrhea.  She has had a endometrial ablation in the past.  Bleeding is Light. Patient's last menstrual period was 01/25/2019 (exact date).  Right thumb pain: Had mild improvement with nsaids, night splint and nsaids. She tried to start stretches but it was painful. Pain now present ~ 3-4 weeks. Finished steroids last week.   Health maintenance:  Colonoscopy: No fhx, screen at 49. Mammogram: [facility] Cousin/aunt on her father's side with breast CA. Mammogram ordered today.  Cervical cancer screening: last pap: 12/26/2017. Neg/neg cotest. Completed: Kelly Duncan. 3-5 yr rpt Immunizations: tdap 12/2016 UTD, Influenza UTD 07/2018 (encouraged yearly) Infectious disease screening: HIV completed DEXA: routine screen Assistive device: none Oxygen HYI:FOYD Patient has a Dental home. Hospitalizations/ED visits: reviewed  Depression screen [facility] 2/9 01/29/2019 12/26/2017  Decreased Interest 0 0  Down, Depressed, Hopeless 0 0  PHQ - 2 Score 0 0   No flowsheet data found.  Immunization History  Administered Date(s) Administered  . Influenza,inj,Quad PF,6+ Mos 07/15/2018  . Influenza-Unspecified 04/18/2013, 07/02/2018   . Tdap 12/03/2016   Past Medical History:  Diagnosis Date  . Cervical radiculopathy at C7 2017  . Chicken pox   . Constipation   . Dysmenorrhea   . Endometriosis   . Frequent headaches   . Ganglion cyst of finger of right hand 11/22/2014  . Hepatitis A    childhood  . Left cervical lymphadenopathy 09/18/2015   benign appearing [place]  . Plantar fasciitis   . Positive ANA (antinuclear antibody) 03/02/2015   Negative anti--DNA screen, negative intrinsic factor antibody, normal sed rate, normal CRP, normal SCL-70 antibody, normal Jo 1 antibody, centromere antibody, normal histone antibody  . 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  Marland Kitchen Vitamin B deficiency 09/08/2015  . Vitamin D deficiency 09/08/2015   No Known Allergies Past Surgical History:  Procedure Laterality Date  . DILATION AND CURETTAGE OF UTERUS    . ENDOMETRIAL ABLATION  05/2006  . ESOPHAGOGASTRODUODENOSCOPY  01/18/2016   Out esophagitis.  Meprazole 20 mg daily recommended.  . TUBAL LIGATION  2006   Family History  Problem Relation Age of Onset  . Hypertension Mother   . Hyperlipidemia Mother   . Thyroid disease Mother   . Arthritis Mother   . Diabetes Mother   . Hyperlipidemia Father   . Prostate cancer Father   . Hearing loss Father   . Hypertension Father   . Heart disease Father   . Hyperlipidemia Sister   . Diabetes Sister   . Arthritis Maternal Grandmother   . Diabetes Maternal Grandmother   . Hyperlipidemia Maternal Grandmother   . Hypertension Maternal  Grandmother   . Stroke Maternal Grandmother   . Diabetes Maternal Grandfather   . Hyperlipidemia Sister   . Breast cancer Cousin   . Breast cancer Paternal Aunt    Social History   Social History Narrative   Married.  Some college.  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   [place].   Smoke alarm in the home.   Wears her seatbelt.   Feels safe  in her relationships.    Allergies as of 01/29/2019   No Known Allergies     Medication List       Accurate as of January 29, 2019  2:32 PM. If you have any questions, ask your nurse or doctor.        STOP taking these medications   predniSONE 20 MG tablet Commonly known as: DELTASONE Stopped by: Kelly Pacinienee Pearla Mckinny, DO       All past medical history, surgical history, allergies, family history, immunizations andmedications were updated in the EMR today and reviewed under the history and medication portions of their EMR.     No results found for this or any previous visit (from the past 2160 hour(s)).  Mm 3d Screen Breast Bilateral  Result Date: 03/14/2018 CLINICAL DATA:  Screening. EXAM: DIGITAL SCREENING BILATERAL MAMMOGRAM WITH TOMO AND CAD COMPARISON:  Previous exam(s). ACR Breast Density Category b: There are scattered areas of fibroglandular density. FINDINGS: There are no findings suspicious for malignancy. Images were processed with CAD. IMPRESSION: No mammographic evidence of malignancy. A result letter of this screening mammogram will be mailed directly to the patient. RECOMMENDATION: Screening mammogram in one year. (Code:SM-B-01Y) BI-RADS CATEGORY  1: Negative. Electronically Signed   By: Harmon PierJeffrey  Hu M.D.   On: 03/14/2018 15:38     ROS: 14 pt review of systems performed and negative (unless mentioned in an HPI)  Objective: BP 101/67 (BP Location: Left Arm, Patient Position: Sitting, Cuff Size: Normal)   Pulse 78   Temp 98.7 F (37.1 C) (Temporal)   Resp 17   Ht 4' 10.66" (1.49 m)   Wt 140 lb 2 oz (63.6 kg)   LMP 01/25/2019 (Exact Date)   SpO2 99%   BMI 28.63 kg/m  Gen: Afebrile. No acute distress. Nontoxic in appearance, well-developed, well-nourished,  Pleasant female.  HENT: AT. [place]. Bilateral TM visualized and normal in appearance, normal external auditory canal. MMM, no oral lesions, adequate dentition. Bilateral nares within normal limits. Throat without erythema,  ulcerations or exudates. no Cough on exam, no hoarseness on exam. Eyes:Pupils Equal Round Reactive to light, Extraocular movements intact,  Conjunctiva without redness, discharge or icterus. Neck/lymp/endocrine: Supple,no lymphadenopathy, no thyromegaly CV: RRR no murmur, no edema, +2/4 P posterior tibialis pulses. no carotid bruits. No JVD. Chest: CTAB, no wheeze, rhonchi or crackles. normal Respiratory effort. good Air movement. Abd: Soft. flat. NTND. BS present. no Masses palpated. No hepatosplenomegaly. No rebound tenderness or guarding. Skin: no rashes, purpura or petechiae. Warm and well-perfused. Skin intact. Neuro/Msk:  Normal gait. PERLA. EOMi. Alert. Oriented x3.  Cranial nerves II through XII intact. Muscle strength 5/5 bilateral lower extremity. DTRs equal bilaterally.   - Right thumb: no erythema, no soft tissue swelling. +finklestein. Neg tinels.  Psych: Normal affect, dress and demeanor. Normal speech. Normal thought content and judgment.   No exam data present  Assessment/plan: Kelly Duncan is a 43 y.o. female present for CPE Overweight (BMI 25.0-29.9) - diet and exercise  - Lipid panel Hypertriglyceridemia - diet  and exercise.  - Comprehensive metabolic panel - Lipid panel - TSH Screening for deficiency anemia - CBC with Differential/Platelet Diabetes mellitus screening - Hemoglobin A1c Breast cancer screening - MM 3D SCREEN BREAST BILATERAL; Future De Quervain's tenosynovitis, right > 15 minutes spent with patient, > 50% of that time face to face for this problem.  - mild improvement with prednisone and nsaids, but no resolution. Exam still rather tender.  - thumb spica splint 24 hrs except for showers- for 2 weeks. Provided today.  - f/u 2-3 weeks.  Encounter for preventive health examination Patient was encouraged to exercise greater than 150 minutes a week. Patient was encouraged to choose a diet filled with fresh fruits and vegetables, and lean meats. AVS  provided to patient today for education/recommendation on gender specific health and safety maintenance. Colonoscopy: No fhx, screen at 50. Mammogram: [facility] Cousin/aunt on her father's side with breast CA. Mammogram ordered today.  Cervical cancer screening: last pap: 12/26/2017. Neg/neg cotest. Completed: Kelly Duncan. 3-5 yr rpt Immunizations: tdap 12/2016 UTD, Influenza UTD 07/2018 (encouraged yearly) Infectious disease screening: HIV completed DEXA: routine screen   Return in about 1 year (around 01/29/2020) for CPE (30 min). - f/u 2-3 weeks. For thumb  CPE plus additional > 15 minutes spent with patient, > 50% of that time face to face for follow up on acute concern    Electronically signed by: Kelly Pacinienee Monette Omara, DO [facility] [place]

## 2019-01-30 LAB — CBC WITH DIFFERENTIAL/PLATELET
Basophils Absolute: 0.1 10*3/uL (ref 0.0–0.1)
Basophils Relative: 0.8 % (ref 0.0–3.0)
Eosinophils Absolute: 0.1 10*3/uL (ref 0.0–0.7)
Eosinophils Relative: 1.3 % (ref 0.0–5.0)
HCT: 42.9 % (ref 36.0–46.0)
Hemoglobin: 14.7 g/dL (ref 12.0–15.0)
Lymphocytes Relative: 34.9 % (ref 12.0–46.0)
Lymphs Abs: 2.9 10*3/uL (ref 0.7–4.0)
MCHC: 34.2 g/dL (ref 30.0–36.0)
MCV: 93.5 fl (ref 78.0–100.0)
Monocytes Absolute: 0.4 10*3/uL (ref 0.1–1.0)
Monocytes Relative: 5.5 % (ref 3.0–12.0)
Neutro Abs: 4.7 10*3/uL (ref 1.4–7.7)
Neutrophils Relative %: 57.5 % (ref 43.0–77.0)
Platelets: 265 10*3/uL (ref 150.0–400.0)
RBC: 4.59 Mil/uL (ref 3.87–5.11)
RDW: 13 % (ref 11.5–15.5)
WBC: 8.2 10*3/uL (ref 4.0–10.5)

## 2019-01-30 LAB — COMPREHENSIVE METABOLIC PANEL
ALT: 13 U/L (ref 0–35)
AST: 14 U/L (ref 0–37)
Albumin: 4.2 g/dL (ref 3.5–5.2)
Alkaline Phosphatase: 65 U/L (ref 39–117)
BUN: 22 mg/dL (ref 6–23)
CO2: 29 mEq/L (ref 19–32)
Calcium: 9.8 mg/dL (ref 8.4–10.5)
Chloride: 101 mEq/L (ref 96–112)
Creatinine, Ser: 0.77 mg/dL (ref 0.40–1.20)
GFR: 81.35 mL/min (ref >60.00)
Glucose, Bld: 81 mg/dL (ref 70–99)
Potassium: 4.5 mEq/L (ref 3.5–5.1)
Sodium: 138 mEq/L (ref 135–145)
Total Bilirubin: 0.3 mg/dL (ref 0.2–1.2)
Total Protein: 6.9 g/dL (ref 6.0–8.3)

## 2019-01-30 LAB — HEMOGLOBIN A1C: Hgb A1c MFr Bld: 5.1 % (ref 4.6–6.5)

## 2019-01-30 LAB — LIPID PANEL
Cholesterol: 248 mg/dL — ABNORMAL HIGH (ref 0–200)
HDL: 39 mg/dL — ABNORMAL LOW (ref >39.00)
Total CHOL/HDL Ratio: 6
Triglycerides: 966 mg/dL — ABNORMAL HIGH (ref 0.0–149.0)

## 2019-01-30 LAB — TSH: TSH: 1.04 u[IU]/mL (ref 0.35–4.50)

## 2019-01-30 LAB — LDL CHOLESTEROL, DIRECT: Direct LDL: 93 mg/dL

## 2019-01-31 ENCOUNTER — Telehealth: Payer: Self-pay | Admitting: Family Medicine

## 2019-01-31 DIAGNOSIS — E781 Pure hyperglyceridemia: Secondary | ICD-10-CM

## 2019-01-31 NOTE — Telephone Encounter (Signed)
Pt was called and given results. Appt was made for 2 weeks and she would like to have everything done at the same time, including labs. Pt verbalized understanding with plan

## 2019-01-31 NOTE — Telephone Encounter (Addendum)
Please inform patient the following information: Labs are normal with the following exceptions: -Her triglycerides are astronomically high.  The other parts of her cholesterol panel are fine.  She was not fasting prior to this collection to that does factor in some, however her triglycerides were close to 1000 and that is extremely high even if she had just eaten a fatty meal. -I would suggest she start a omega-3/fish oil supplementation 2-3 g (2000-3000 mg) daily and a daily fiber supplement such as Metamucil. -   I would recommend a mediterranean diet and regular exercise.  A mediterranean diet is high in fruits, vegetables, whole grains, fish, chicken, nuts, healthy fats (olive oil or canola oil). Low fat dairy. There are many online resources and books on this diet. Limit butter, margarine, red meat and sweets.   - Repeat her lipid panel in 2 weeks at her follow-up for her wrist/thumb pain.  She should be fasting for 9 hours-only.  If she would like she can come in 1 to 2 days prior to her follow-up appointment and have her fasting labs completed so that we can discuss during her appointment.  I have placed the order in case she would like to do that.

## 2019-01-31 NOTE — Telephone Encounter (Signed)
Pt was called and message was left to return call  

## 2019-02-12 ENCOUNTER — Other Ambulatory Visit: Payer: Self-pay

## 2019-02-12 ENCOUNTER — Encounter: Payer: Self-pay | Admitting: Family Medicine

## 2019-02-12 ENCOUNTER — Ambulatory Visit: Payer: 59 | Admitting: Family Medicine

## 2019-02-12 DIAGNOSIS — M654 Radial styloid tenosynovitis [de Quervain]: Secondary | ICD-10-CM

## 2019-02-12 DIAGNOSIS — E781 Pure hyperglyceridemia: Secondary | ICD-10-CM

## 2019-02-12 HISTORY — DX: Radial styloid tenosynovitis (de quervain): M65.4

## 2019-02-12 NOTE — Patient Instructions (Signed)
High Triglycerides Eating Plan Triglycerides are a type of fat in the blood. High levels of triglycerides can increase your risk of heart disease and stroke. If your triglyceride levels are high, choosing the right foods can help lower your triglycerides and keep your heart healthy. Work with your health care provider or a diet and nutrition specialist (dietitian) to develop an eating plan that is right for you. What are tips for following this plan? General guidelines   Lose weight, if you are overweight. For most people, losing 5-10 lbs (2-5 kg) helps lower triglyceride levels. A weight-loss plan may include. ? 30 minutes of exercise at least 5 days a week. ? Reducing the amount of calories, sugar, and fat you eat.  Eat a wide variety of fresh fruits, vegetables, and whole grains. These foods are high in fiber.  Eat foods that contain healthy fats, such as fatty fish, nuts, seeds, and olive oil.  Avoid foods that are high in added sugar, added salt (sodium), saturated fat, and trans fat.  Avoid low-fiber, refined carbohydrates such as white bread, crackers, noodles, and white rice.  Avoid foods with partially hydrogenated oils (trans fats), such as fried foods or stick margarine.  Limit alcohol intake to no more than 1 drink a day for nonpregnant women and 2 drinks a day for men. One drink equals 12 oz of beer, 5 oz of wine, or 1 oz of hard liquor. Your health care provider may recommend that you drink less depending on your overall health. Reading food labels  Check food labels for the amount of saturated fat. Choose foods with no or very little saturated fat.  Check food labels for the amount of trans fat. Choose foods with no trans fat.  Check food labels for the amount of cholesterol. Choose foods low in cholesterol. Ask your dietitian how much cholesterol you should have each day.  Check food labels for the amount of sodium. Choose foods with less than 140 milligrams (mg) per  serving. Shopping  Buy dairy products labeled as nonfat (skim) or low-fat (1%).  Avoid buying processed or prepackaged foods. These are often high in added sugar, sodium, and fat. Cooking  Choose healthy fats when cooking, such as olive oil or canola oil.  Cook foods using lower fat methods, such as baking, broiling, boiling, or grilling.  Make your own sauces, dressings, and marinades when possible, instead of buying them. Store-bought sauces, dressings, and marinades are often high in sodium and sugar. Meal planning  Eat more home-cooked food and less restaurant, buffet, and fast food.  Eat fatty fish at least 2 times each week. Examples of fatty fish include salmon, trout, mackerel, tuna, and herring.  If you eat whole eggs, do not eat more than 3 egg yolks per week. What foods are recommended? The items listed may not be a complete list. Talk with your dietitian about what dietary choices are best for you. Grains Whole wheat or whole grain breads, crackers, cereals, and pasta. Unsweetened oatmeal. Bulgur. Barley. Quinoa. Brown rice. Whole wheat flour tortillas. Vegetables Fresh or frozen vegetables. Low-sodium canned vegetables. Fruits All fresh, canned (in natural juice), or frozen fruits. Meats and other protein foods Skinless chicken or turkey. Ground chicken or turkey. Lean cuts of pork, trimmed of fat. Fish and seafood, especially salmon, trout, and herring. Egg whites. Dried beans, peas, or lentils. Unsalted nuts or seeds. Unsalted canned beans. Natural peanut or almond butter. Dairy Low-fat dairy products. Skim or low-fat (1%) milk. Reduced fat (  2%) and low-sodium cheese. Low-fat ricotta cheese. Low-fat cottage cheese. Plain, low-fat yogurt. Fats and oils Tub margarine without trans fats. Light or reduced-fat mayonnaise. Light or reduced-fat salad dressings. Avocado. Safflower, olive, sunflower, soybean, and canola oils. What foods are not recommended? The items listed  may not be a complete list. Talk with your dietitian about what dietary choices are best for you. Grains White bread. White (regular) pasta. White rice. Cornbread. Bagels. Pastries. Crackers that contain trans fat. Vegetables Creamed or fried vegetables. Vegetables in a cheese sauce. Fruits Sweetened dried fruit. Canned fruit in syrup. Fruit juice. Meats and other protein foods Fatty cuts of meat. Ribs. Chicken wings. Bacon. Sausage. Bologna. Salami. Chitterlings. Fatback. Hot dogs. Bratwurst. Packaged lunch meats. Dairy Whole or reduced-fat (2%) milk. Half-and-half. Cream cheese. Full-fat or sweetened yogurt. Full-fat cheese. Nondairy creamers. Whipped toppings. Processed cheese or cheese spreads. Cheese curds. Beverages Alcohol. Sweetened drinks, such as soda, lemonade, fruit drinks, or punches. Fats and oils Butter. Stick margarine. Lard. Shortening. Ghee. Bacon fat. Tropical oils, such as coconut, palm kernel, or palm oils. Sweets and desserts Corn syrup. Sugars. Honey. Molasses. Candy. Jam and jelly. Syrup. Sweetened cereals. Cookies. Pies. Cakes. Donuts. Muffins. Ice cream. Condiments Store-bought sauces, dressings, and marinades that are high in sugar, such as ketchup and barbecue sauce. Summary  High levels of triglycerides can increase the risk of heart disease and stroke. Choosing the right foods can help lower your triglycerides.  Eat plenty of fresh fruits, vegetables, and whole grains. Choose low-fat dairy and lean meats. Eat fatty fish at least twice a week.  Avoid processed and prepackaged foods with added sugar, sodium, saturated fat, and trans fat.  If you need suggestions or have questions about what types of food are good for you, talk with your health care provider or a dietitian. This information is not intended to replace advice given to you by your health care provider. Make sure you discuss any questions you have with your health care provider. Document Released:  04/07/2004 Document Revised: 06/02/2017 Document Reviewed: 08/23/2016 Elsevier Patient Education  2020 [employer]  

## 2019-02-12 NOTE — Progress Notes (Signed)
SUBJECTIVE Chief Complaint  Patient presents with  . Repeat labs    Pt is fasting to get repeat lipid panel   . Hand Pain    Pt states hand is doing much better but does still have some pain     HPI: Kelly Duncan is a 43 y.o. female  Hypertriglyceridemia: Patient was present 2 weeks ago for her CPE in which her triglycerides were greater than 950.  She had not been completely fasting.  Discussed lab results with her today and even without fasting those are extremely high triglyceride levels.  She does have a family history of hypertriglyceridemia.  She reports she has not changed her diet much over the last year.  She has gained some weight.  She does not consume dairy, fried foods or fatty meats.  Her diet is mostly consistent with chicken and fish.  She does take a fish oil supplement. Right thumb pain:  Patient returns today after additional 2 weeks in the provided wrist splint.  She states she has seen great improvement with the new wrist splint.  She reports only mild discomfort with turning motions such as in turning a doorknob. Prior note: Had mild improvement with nsaids, night splint and nsaids. She tried to start stretches but it was painful. Pain now present ~ 3-4 weeks. Finished steroids last week.  Prior: present for right hand pain that started approximately 1 week ago.  She denies any injury or change in activity.  She states the pain is worse when turning her hand and wrist such as in turning a doorknob.  She states when she does that the pain will shoot up her arm.  She denies any fever, chills or redness.  She has been taking Advil 800 mg every 8 hours.  She reports the pain is worse surrounding her index and thumb.  She reports a history of cervical spine narrowing and does not know if this has anything to do with it.  ROS: See pertinent positives and negatives per HPI.  Patient Active Problem List   Diagnosis Date Noted  . Overweight (BMI 25.0-29.9) 01/29/2019   . Encounter for routine gynecological examination with Papanicolaou smear of cervix 12/26/2017  . Dysmenorrhea     Social History   Tobacco Use  . Smoking status: Never Smoker  . Smokeless tobacco: Never Used  Substance Use Topics  . Alcohol use: Never    Frequency: Never    Current Outpatient Medications:  .  Omega-3 Fatty Acids (FISH OIL) 1200 MG CAPS, Take 2 capsules by mouth daily., Disp: , Rfl:   No Known Allergies  OBJECTIVE: BP 101/67 (BP Location: Left Arm, Patient Position: Sitting, Cuff Size: Normal)   Pulse 69   Temp 98 F (36.7 C) (Temporal)   Resp 18   Ht 4\' 11"  (1.499 m)   Wt 141 lb 2 oz (64 kg)   LMP 01/25/2019 (Exact Date)   SpO2 98%   BMI 28.50 kg/m  Gen: Afebrile. No acute distress.  MSK: Right hand without erythema, no soft tissue swelling.  Negative Finkelstein.  Full range of motion.  Negative Tinel's at wrist.  Normal thumb to fingers without discomfort.  Mild discomfort with resisted pronation and rotation still present.  Neurovascular intact distally. Neuro:  Normal gait. PERLA. EOMi. Alert. Oriented x3    ASSESSMENT AND PLAN: Kelly Duncan is a 43 y.o. female present for  De Quervain's tenosynovitis -Greatly improved over the last 2 weeks.  Would encourage  her to wear a wrist splint an additional 1-2 weeks before attempting to remove completely.  She is understands the instructions today.  Hypertriglyceridemia: Rather high triglycerides.  She has been fasting approximately 10 hours today.  Will repeat lipid panel.  Briefly discussed different types of medication that could be considered depending upon what her panel results labs.  Discussed fiber-based medicines, omega-3 base medicines and statins. She was provided with dietary instructions.  She was encouraged to increase her exercise.    Howard Pouch, DO 02/12/2019

## 2019-02-13 LAB — LIPID PANEL
Cholesterol: 206 mg/dL — ABNORMAL HIGH (ref 0–200)
HDL: 43.2 mg/dL (ref >39.00)
LDL Cholesterol: 127 mg/dL — ABNORMAL HIGH (ref 0–99)
NonHDL: 162.38
Total CHOL/HDL Ratio: 5
Triglycerides: 176 mg/dL — ABNORMAL HIGH (ref 0.0–149.0)
VLDL: 35.2 mg/dL (ref 0.0–40.0)

## 2019-04-11 ENCOUNTER — Ambulatory Visit
Admission: RE | Admit: 2019-04-11 | Discharge: 2019-04-11 | Disposition: A | Discharge status: Home / Self Care | Payer: 59 | Source: Ambulatory Visit | Attending: Family Medicine | Admitting: Family Medicine

## 2019-04-11 ENCOUNTER — Other Ambulatory Visit: Payer: Self-pay

## 2019-04-11 DIAGNOSIS — Z1239 Encounter for other screening for malignant neoplasm of breast: Secondary | ICD-10-CM

## 2019-04-15 ENCOUNTER — Other Ambulatory Visit: Payer: Self-pay | Admitting: Family Medicine

## 2019-04-15 ENCOUNTER — Telehealth: Payer: Self-pay

## 2019-04-15 DIAGNOSIS — R928 Other abnormal and inconclusive findings on diagnostic imaging of breast: Secondary | ICD-10-CM

## 2019-04-15 NOTE — Telephone Encounter (Signed)
Referral for patient to the [facility] of [place] placed on Dr. Lucita Lora desk 04/15/2019 to be signed and dated. Please advise. Thank you

## 2019-04-16 ENCOUNTER — Encounter: Payer: Self-pay | Admitting: Family Medicine

## 2019-04-16 DIAGNOSIS — R928 Other abnormal and inconclusive findings on diagnostic imaging of breast: Secondary | ICD-10-CM | POA: Insufficient documentation

## 2019-04-16 NOTE — Telephone Encounter (Signed)
completed

## 2019-04-22 ENCOUNTER — Ambulatory Visit
Admission: RE | Admit: 2019-04-22 | Discharge: 2019-04-22 | Disposition: A | Discharge status: Home / Self Care | Payer: 59 | Source: Ambulatory Visit | Attending: Family Medicine | Admitting: Family Medicine

## 2019-04-22 ENCOUNTER — Other Ambulatory Visit: Payer: Self-pay

## 2019-04-22 ENCOUNTER — Ambulatory Visit: Payer: 59

## 2019-04-22 DIAGNOSIS — R928 Other abnormal and inconclusive findings on diagnostic imaging of breast: Secondary | ICD-10-CM

## 2019-08-28 ENCOUNTER — Other Ambulatory Visit: Payer: Self-pay

## 2019-08-28 ENCOUNTER — Encounter: Payer: Self-pay | Admitting: Family Medicine

## 2019-08-28 ENCOUNTER — Ambulatory Visit: Payer: Managed Care, Other (non HMO) | Admitting: Family Medicine

## 2019-08-28 VITALS — BP 113/74 | HR 73 | Ht 59.0 in | Wt 145.0 lb

## 2019-08-28 DIAGNOSIS — R200 Anesthesia of skin: Secondary | ICD-10-CM

## 2019-08-28 DIAGNOSIS — R61 Generalized hyperhidrosis: Secondary | ICD-10-CM | POA: Diagnosis not present

## 2019-08-28 DIAGNOSIS — N938 Other specified abnormal uterine and vaginal bleeding: Secondary | ICD-10-CM

## 2019-08-28 DIAGNOSIS — R42 Dizziness and giddiness: Secondary | ICD-10-CM | POA: Diagnosis not present

## 2019-08-28 DIAGNOSIS — R202 Paresthesia of skin: Secondary | ICD-10-CM

## 2019-08-28 NOTE — Progress Notes (Signed)
This visit occurred during the SARS-CoV-2 public health emergency.  Safety protocols were in place, including screening questions prior to the visit, additional usage of staff PPE, and extensive cleaning of exam room while observing appropriate contact time as indicated for disinfecting solutions.    Kelly Duncan , 1976/03/28, 44 y.o., female MRN: [id number] Patient Care Team    Relationship Specialty Notifications Start End  Ma Hillock, DO PCP - General Family Medicine  12/26/17     Chief Complaint  Patient presents with  . Menstrual Problem  . Foot Pain    bilateral  . Hand Pain    bilateral  . Dizziness    when sleeping     Subjective: Pt presents for an OV with multiple complaints which patient states has occurred over the last 6 months.  Hand and feet discomfort started approximately 6 months ago.  The vertigo symptoms started approximately 4 months ago.  Menstrual cycle changes that have occurred "last few months. " She reports she has noticed decreased sensation in bilateral hands and bilateral feet.  She notices some mild swelling in her hands.  She endorses when she lays down she feels a throbbing pain her legs and her heels become numb.  When she is walking she has increased pain in her legs.  She does endorse the left is greater than the right.  She states she is feeling dizzy when she closes her eyes.  She reports she feels like her eyes are moving and she feels like she is spinning.  When she opens her eyes she has a moment where she cannot focus and everything seems upside down.  She reports when walking she sometimes will veer to the right and she feels like she is going to fall down.  She denies falls or syncopal episodes.  She also endorses menstrual irregularities.  She reports her menstrual cycles is 14 days late this month.  She has noticed it has been about 5 to 6 days late the past few months.  She feels her bleeding pattern is the same for approximately 2  days and light each cycle.  She does endorse headache with her menstrual cycles, but that has been constant for her and unchanged. Patient's last menstrual period was 08/23/2019.  Depression screen [facility] 2/9 01/29/2019 12/26/2017  Decreased Interest 0 0  Down, Depressed, Hopeless 0 0  PHQ - 2 Score 0 0    No Known Allergies Social History   Social History Narrative   Married.  Some college.  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   Moved from [place] April 2019.   Smoke alarm in the home.   Wears her seatbelt.   Feels safe in her relationships.   Past Medical History:  Diagnosis Date  . Cervical radiculopathy at C7 2017  . Chicken pox   . Constipation   . Dysmenorrhea   . Endometriosis   . Frequent headaches   . Ganglion cyst of finger of right hand 11/22/2014  . Hepatitis A    childhood  . Left cervical lymphadenopathy 09/18/2015   benign appearing [place]  . Plantar fasciitis   . Positive ANA (antinuclear antibody) 03/02/2015   Negative anti--DNA screen, negative intrinsic factor antibody, normal sed rate, normal CRP, normal SCL-70 antibody, normal Jo 1 antibody, centromere antibody, normal histone antibody  . 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  Marland Kitchen Vitamin B deficiency  09/08/2015  . Vitamin D deficiency 09/08/2015   Past Surgical History:  Procedure Laterality Date  . DILATION AND CURETTAGE OF UTERUS    . ENDOMETRIAL ABLATION  05/2006  . ESOPHAGOGASTRODUODENOSCOPY  01/18/2016   Out esophagitis.  Meprazole 20 mg daily recommended.  . TUBAL LIGATION  2006   Family History  Problem Relation Age of Onset  . Hypertension Mother   . Hyperlipidemia Mother   . Thyroid disease Mother   . Arthritis Mother   . Diabetes Mother   . Hyperlipidemia Father   . Prostate cancer Father   . Hearing loss Father   . Hypertension Father   . Heart disease Father   . Hyperlipidemia Sister   . Diabetes  Sister   . Arthritis Maternal Grandmother   . Diabetes Maternal Grandmother   . Hyperlipidemia Maternal Grandmother   . Hypertension Maternal Grandmother   . Stroke Maternal Grandmother   . Diabetes Maternal Grandfather   . Hyperlipidemia Sister   . Breast cancer Cousin   . Breast cancer Paternal Aunt    Allergies as of 08/28/2019   No Known Allergies     Medication List       Accurate as of August 28, 2019 11:59 PM. If you have any questions, ask your nurse or doctor.        STOP taking these medications   Fish Oil 1200 MG Caps Stopped by: Howard Pouch, DO     TAKE these medications   ibuprofen 200 MG tablet Commonly known as: ADVIL Take 200 mg by mouth as needed.       All past medical history, surgical history, allergies, family history, immunizations andmedications were updated in the EMR today and reviewed under the history and medication portions of their EMR.     ROS: Negative, with the exception of above mentioned in HPI   Objective:  BP 113/74 (BP Location: Left Arm, Patient Position: Sitting, Cuff Size: Normal)   Pulse 73   Ht _0  (1.499 m)   Wt 145 lb (65.8 kg)   LMP 08/23/2019   BMI 29.29 kg/m  Body mass index is 29.29 kg/m. Gen: Afebrile. No acute distress. Nontoxic in appearance, well developed, well nourished.  HENT: AT. [place]. Bilateral TM visualized without erythema or bulging. MMM, no oral lesions. Bilateral nares without erythema or swelling. Throat without erythema or exudates.  No cough.  No shortness of breath. Eyes:Pupils Equal Round Reactive to light, Extraocular movements intact,  Conjunctiva without redness, discharge or icterus. Neck/lymp/endocrine: Supple, no lymphadenopathy, no thyromegaly CV: RRR no murmur, no edema Chest: CTAB, no wheeze or crackles. Good air movement, normal resp effort.  Skin: No rashes, purpura or petechiae.  Neuro:  Normal gait. PERLA. EOMi. Alert. Oriented x3 Cranial nerves II through XII intact. Muscle  strength 5/5 bilateral upper and lower extremity. DTRs equal bilaterally.  Dix-Hallpike maneuver negative today.  However patient reports rapid eye movement/vertigo when laying flat and closing her eyes. Psych: Normal affect, dress and demeanor. Normal speech. Normal thought content and judgment.  No exam data present No results found. No results found for this or any previous visit (from the past 24 hour(s)).  Assessment/Plan: Siera Beyersdorf is a 44 y.o. female present for OV for  Vertigo Discussed with her I am uncertain if all of her symptoms are related to one condition or are each separate conditions within themselves. Discussed ENT referral versus neurological referral versus MRI imaging first.  Would wait until lab results return and discuss  further evaluation at that time.  Patient is agreeable with plan. - CBC w/Diff - TSH - T4, free - T3, free - Iron, TIBC and Ferritin Panel - Comp Met (CMET) - B12 - Vitamin D (25 hydroxy) - Sedimentation rate - C-reactive protein  Numbness and tingling of both lower and both upper extremities Are with laboratory evaluation ruling out different causes of paresthesias. Discussed if laboratory evaluations do not yield because would consider MRI versus referral to neurology.  Patient is in agreement. - CBC w/Diff - TSH - T4, free - T3, free - Iron, TIBC and Ferritin Panel - Comp Met (CMET) - B12 - Vitamin D (25 hydroxy) - Sedimentation rate - C-reactive protein  DUB (dysfunctional uterine bleeding)//night sweats Patient has had menstrual changes over the last few months.  She also endorses having some hot flashes.  Possibly early perimenopause versus dysfunctional uterine bleeding.  We will collect hormone levels today and consider referral to gynecology depending upon those results. - Estradiol - FSH - LH  Reviewed expectations re: course of current medical issues.  Discussed self-management of symptoms.  Outlined signs and  symptoms indicating need for more acute intervention.  Patient verbalized understanding and all questions were answered.  Patient received an After-Visit Summary.    Orders Placed This Encounter  Procedures  . CBC w/Diff  . TSH  . T4, free  . T3, free  . Iron, TIBC and Ferritin Panel  . Comp Met (CMET)  . B12  . Vitamin D (25 hydroxy)  . Sedimentation rate  . C-reactive protein  . Estradiol  . [place]  . LH   No orders of the defined types were placed in this encounter.  Referral Orders  No referral(s) requested today    Greater than 40 minutes was spent with patient covering multiple symptoms.     Note is dictated utilizing voice recognition software. Although note has been proof read prior to signing, occasional typographical errors still can be missed. If any questions arise, please do not hesitate to call for verification.   electronically signed by:  Howard Pouch, DO  [place]

## 2019-08-28 NOTE — Patient Instructions (Signed)
We will call you in a few days with your lab results and discuss further plan.

## 2019-08-29 LAB — C-REACTIVE PROTEIN: CRP: 1.6 mg/L (ref <8.0)

## 2019-08-29 LAB — CBC WITH DIFFERENTIAL/PLATELET
Absolute Monocytes: 442 cells/uL (ref 200–950)
Basophils Absolute: 24 cells/uL (ref 0–200)
Basophils Relative: 0.3 %
Eosinophils Absolute: 269 cells/uL (ref 15–500)
Eosinophils Relative: 3.4 %
HCT: 42.1 % (ref 35.0–45.0)
Hemoglobin: 13.9 g/dL (ref 11.7–15.5)
Lymphs Abs: 2789 cells/uL (ref 850–3900)
MCH: 30.8 pg (ref 27.0–33.0)
MCHC: 33 g/dL (ref 32.0–36.0)
MCV: 93.3 fL (ref 80.0–100.0)
MPV: 10.3 fL (ref 7.5–12.5)
Monocytes Relative: 5.6 %
Neutro Abs: 4377 cells/uL (ref 1500–7800)
Neutrophils Relative %: 55.4 %
Platelets: 286 10*3/uL (ref 140–400)
RBC: 4.51 10*6/uL (ref 3.80–5.10)
RDW: 13.2 % (ref 11.0–15.0)
Total Lymphocyte: 35.3 %
WBC: 7.9 10*3/uL (ref 3.8–10.8)

## 2019-08-29 LAB — COMPREHENSIVE METABOLIC PANEL
AG Ratio: 1.6 (calc) (ref 1.0–2.5)
ALT: 13 U/L (ref 6–29)
AST: 17 U/L (ref 10–30)
Albumin: 4.4 g/dL (ref 3.6–5.1)
Alkaline phosphatase (APISO): 61 U/L (ref 31–125)
BUN: 20 mg/dL (ref 7–25)
CO2: 25 mmol/L (ref 20–32)
Calcium: 9.4 mg/dL (ref 8.6–10.2)
Chloride: 105 mmol/L (ref 98–110)
Creat: 0.78 mg/dL (ref 0.50–1.10)
Globulin: 2.8 g/dL (calc) (ref 1.9–3.7)
Glucose, Bld: 70 mg/dL (ref 65–99)
Potassium: 4.1 mmol/L (ref 3.5–5.3)
Sodium: 138 mmol/L (ref 135–146)
Total Bilirubin: 0.3 mg/dL (ref 0.2–1.2)
Total Protein: 7.2 g/dL (ref 6.1–8.1)

## 2019-08-29 LAB — VITAMIN D 25 HYDROXY (VIT D DEFICIENCY, FRACTURES): Vit D, 25-Hydroxy: 19 ng/mL — ABNORMAL LOW (ref 30–100)

## 2019-08-29 LAB — ESTRADIOL: Estradiol: 93 pg/mL

## 2019-08-29 LAB — IRON,TIBC AND FERRITIN PANEL
%SAT: 22 % (calc) (ref 16–45)
Ferritin: 118 ng/mL (ref 16–232)
Iron: 66 ug/dL (ref 40–190)
TIBC: 299 mcg/dL (calc) (ref 250–450)

## 2019-08-29 LAB — T3, FREE: T3, Free: 3.2 pg/mL (ref 2.3–4.2)

## 2019-08-29 LAB — TSH: TSH: 1.77 mIU/L

## 2019-08-29 LAB — VITAMIN B12: Vitamin B-12: 420 pg/mL (ref 200–1100)

## 2019-08-29 LAB — SEDIMENTATION RATE: Sed Rate: 12 mm/h (ref 0–20)

## 2019-08-29 LAB — T4, FREE: Free T4: 1.1 ng/dL (ref 0.8–1.8)

## 2019-08-30 ENCOUNTER — Telehealth: Payer: Self-pay | Admitting: Family Medicine

## 2019-08-30 ENCOUNTER — Encounter: Payer: Self-pay | Admitting: Family Medicine

## 2019-08-30 DIAGNOSIS — M2550 Pain in unspecified joint: Secondary | ICD-10-CM

## 2019-08-30 DIAGNOSIS — R200 Anesthesia of skin: Secondary | ICD-10-CM | POA: Insufficient documentation

## 2019-08-30 DIAGNOSIS — R61 Generalized hyperhidrosis: Secondary | ICD-10-CM | POA: Insufficient documentation

## 2019-08-30 DIAGNOSIS — N938 Other specified abnormal uterine and vaginal bleeding: Secondary | ICD-10-CM | POA: Insufficient documentation

## 2019-08-30 DIAGNOSIS — R42 Dizziness and giddiness: Secondary | ICD-10-CM | POA: Insufficient documentation

## 2019-08-30 DIAGNOSIS — R519 Headache, unspecified: Secondary | ICD-10-CM

## 2019-08-30 MED ORDER — VITAMIN D (ERGOCALCIFEROL) 1.25 MG (50000 UNIT) PO CAPS: 50000.0000 [IU] | ORAL_CAPSULE | ORAL | 0 refills | Status: DC

## 2019-08-30 NOTE — Telephone Encounter (Signed)
Pt was called and given lab results, she verbalized understanding. appts were scheduled

## 2019-08-30 NOTE — Telephone Encounter (Signed)
Please inform patient the following information: Her liver, kidney, electrolyte and blood cell counts are normal. Her iron levels and inflammatory markers are normal Thyroid panel was normal. Vitamin D is rather low at 19. I have called in high dose once weekly vit d supplement.  Her B12 is lower end of the normal range at 420.  I would also encourage her to start an over-the-counter 500 mg of B12 daily.  I have placed an order for 2 additional labs to continue evaluate her symptoms.  Please have her placed on the lab schedule early next week with a provider appointment 2 days after to discuss those labs and further testing/imaging etc.

## 2019-08-30 NOTE — Telephone Encounter (Signed)
Pt was called and message was left to return call  

## 2019-09-02 LAB — FOLLICLE STIMULATING HORMONE: FSH: 9.5 m[IU]/mL

## 2019-09-02 LAB — LUTEINIZING HORMONE: LH: 5.1 m[IU]/mL

## 2019-09-03 ENCOUNTER — Ambulatory Visit: Payer: Managed Care, Other (non HMO)

## 2019-09-09 ENCOUNTER — Ambulatory Visit: Payer: Managed Care, Other (non HMO) | Admitting: Family Medicine

## 2019-11-13 ENCOUNTER — Other Ambulatory Visit: Payer: Self-pay | Admitting: Family Medicine

## 2020-01-19 DIAGNOSIS — N946 Dysmenorrhea, unspecified: Secondary | ICD-10-CM | POA: Insufficient documentation

## 2020-06-03 ENCOUNTER — Other Ambulatory Visit: Payer: Self-pay

## 2020-06-03 ENCOUNTER — Ambulatory Visit: Payer: 59 | Admitting: Family Medicine

## 2020-06-03 ENCOUNTER — Encounter: Payer: Self-pay | Admitting: Family Medicine

## 2020-06-03 VITALS — BP 100/58 | HR 72 | Temp 98.8°F | Ht 60.0 in | Wt 138.0 lb

## 2020-06-03 DIAGNOSIS — M6740 Ganglion, unspecified site: Secondary | ICD-10-CM | POA: Diagnosis not present

## 2020-06-03 DIAGNOSIS — M4802 Spinal stenosis, cervical region: Secondary | ICD-10-CM | POA: Diagnosis not present

## 2020-06-03 DIAGNOSIS — M5412 Radiculopathy, cervical region: Secondary | ICD-10-CM | POA: Insufficient documentation

## 2020-06-03 HISTORY — DX: Ganglion, unspecified site: M67.40

## 2020-06-03 MED ORDER — CYCLOBENZAPRINE HCL 5 MG PO TABS: 5.0000 mg | ORAL_TABLET | Freq: Two times a day (BID) | ORAL | 0 refills | Status: DC | PRN

## 2020-06-03 MED ORDER — NAPROXEN 500 MG PO TABS: 500.0000 mg | ORAL_TABLET | Freq: Two times a day (BID) | ORAL | 0 refills | Status: DC

## 2020-06-03 MED ORDER — PREDNISONE 20 MG PO TABS: ORAL_TABLET | ORAL | 0 refills | Status: DC

## 2020-06-03 NOTE — Patient Instructions (Signed)

## 2020-06-03 NOTE — Progress Notes (Signed)
This visit occurred during the SARS-CoV-2 public health emergency.  Safety protocols were in place, including screening questions prior to the visit, additional usage of staff PPE, and extensive cleaning of exam room while observing appropriate contact time as indicated for disinfecting solutions.    Kelly Duncan , 06-16-1976, 44 y.o., female MRN: [id number] Patient Care Team    Relationship Specialty Notifications Start End  Kelly Leatherwood, DO PCP - General Family Medicine  12/26/17     Chief Complaint  Patient presents with  . Neck Pain    pt c/o neck pain that radiates to shoulder x 3 weeks;   Marland Kitchen Cyst    pt c/o cyst on left wrist x 1 mo; pain by palpation; pt is aware provider will discuss if have time     Subjective: Pt presents for an OV with complaints of right-sided neck and shoulder pain that started approximately 3 weeks ago.  She has been taking 800 mg of Advil twice a day to help with discomfort it is helpful but has not taken away the pain.  She denies any known recent injury or change in activity that could had initiated discomfort.  She has a history of cervical stenosis by EMR review she had an imaging study in 2018 that showed cervical stenosis at C4-C5 that was mild and mild-moderate at C5-C6.  She describes the pain as a burning pain that is on the right side of her neck and across to her shoulder.  Depression screen [facility] 2/9 06/03/2020 01/29/2019 12/26/2017  Decreased Interest 0 0 0  Down, Depressed, Hopeless 0 0 0  PHQ - 2 Score 0 0 0    No Known Allergies Social History   Social History Narrative   Married.  Some college.  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   Smoke alarm in the home.   Wears her seatbelt.   Feels safe in her relationships.   Past Medical History:  Diagnosis Date  . Cervical radiculopathy at C7 2017  . Chicken pox   . Constipation   . Dysmenorrhea   . Endometriosis   . Frequent headaches    . Ganglion cyst of finger of right hand 11/22/2014  . Hepatitis A    childhood  . Left cervical lymphadenopathy 09/18/2015   benign appearing [place]  . Plantar fasciitis   . Positive ANA (antinuclear antibody) 03/02/2015   Negative anti--DNA screen, negative intrinsic factor antibody, normal sed rate, normal CRP, normal SCL-70 antibody, normal Jo 1 antibody, centromere antibody, normal histone antibody  . 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  Marland Kitchen Vitamin B deficiency 09/08/2015  . Vitamin D deficiency 09/08/2015   Past Surgical History:  Procedure Laterality Date  . DILATION AND CURETTAGE OF UTERUS    . ENDOMETRIAL ABLATION  05/2006  . ESOPHAGOGASTRODUODENOSCOPY  01/18/2016   Out esophagitis.  Meprazole 20 mg daily recommended.  . TUBAL LIGATION  2006   Family History  Problem Relation Age of Onset  . Hypertension Mother   . Hyperlipidemia Mother   . Thyroid disease Mother   . Arthritis Mother   . Diabetes Mother   . Hyperlipidemia Father   . Prostate cancer Father   . Hearing loss Father   . Hypertension Father   . Heart disease Father   . Hyperlipidemia Sister   . Diabetes Sister   .  Arthritis Maternal Grandmother   . Diabetes Maternal Grandmother   . Hyperlipidemia Maternal Grandmother   . Hypertension Maternal Grandmother   . Stroke Maternal Grandmother   . Diabetes Maternal Grandfather   . Hyperlipidemia Sister   . Breast cancer Cousin   . Breast cancer Paternal Aunt    Allergies as of 06/03/2020   No Known Allergies     Medication List       Accurate as of June 03, 2020  5:32 PM. If you have any questions, ask your nurse or doctor.        STOP taking these medications   Vitamin D (Ergocalciferol) 1.25 MG (50000 UNIT) Caps capsule Commonly known as: DRISDOL Stopped by: Felix Pacini, DO     TAKE these medications   cyclobenzaprine 5 MG tablet Commonly known as:  FLEXERIL Take 1 tablet (5 mg total) by mouth 2 (two) times daily as needed for muscle spasms. Started by: Felix Pacini, DO   ibuprofen 200 MG tablet Commonly known as: ADVIL Take 200 mg by mouth as needed.   naproxen 500 MG tablet Commonly known as: Naprosyn Take 1 tablet (500 mg total) by mouth 2 (two) times daily with a meal. Started by: Felix Pacini, DO   predniSONE 20 MG tablet Commonly known as: DELTASONE 60 mg x3d, 40 mg x3d, 20 mg x2d, 10 mg x2d Started by: Felix Pacini, DO       All past medical history, surgical history, allergies, family history, immunizations andmedications were updated in the EMR today and reviewed under the history and medication portions of their EMR.     ROS: Negative, with the exception of above mentioned in HPI   Objective:  BP (!) 100/58   Pulse 72   Temp 98.8 F (37.1 C) (Oral)   Ht 5' (1.524 m)   Wt 138 lb (62.6 kg)   SpO2 100%   BMI 26.95 kg/m  Body mass index is 26.95 kg/m. Gen: Afebrile. No acute distress. Nontoxic in appearance, well developed, well nourished.  HENT: AT. [place].  Eyes:Pupils Equal Round Reactive to light, Extraocular movements intact,  Conjunctiva without redness, discharge or icterus. MSK: No erythema.  No skin changes.  Ropiness of trap muscle on right.  Tender to palpation supraspinatus/trap on the right.  Mild decrease in range of motion right rotation with pain.  Possibly very mild decrease in grip strength on the right in comparison to the left.  Otherwise muscle strength 5/5 bilateral upper extremities.  Negative Hawkins, negative O'Brien's, negative empty can test.  Neurovascularly intact distally.   -Left wrist with small approximately 2 cm ganglion cyst. Neuro: Normal gait. PERLA. EOMi. Alert. Oriented x3  Psych: Normal affect, dress and demeanor. Normal speech. Normal thought content and judgment.  No exam data present No results found. No results found for this or any previous visit (from the past 24  hour(s)).  Assessment/Plan: Kelly Duncan is a 44 y.o. female present for OV for  Cervical radiculopathy/cervical stenosis Rest.  No lifting heavy objects.  Heating pad can be helpful. Naproxen twice daily 5-7 days with food Prednisone taper prescribed Flexeril twice daily as needed Follow-up 2-4 weeks if not seeing improvement.  Would need to consider imaging study given her history of cervical stenosis and Lyrica versus gabapentin start.  Ganglion cyst of left wrist: Reassured patient.  If enlarges or starts to become painful would refer to surgery to have removed since this is a reoccurring ganglion cyst.  Patient is agreement with plan  Reviewed expectations re: course of current medical issues.  Discussed self-management of symptoms.  Outlined signs and symptoms indicating need for more acute intervention.  Patient verbalized understanding and all questions were answered.  Patient received an After-Visit Summary.    No orders of the defined types were placed in this encounter.  Meds ordered this encounter  Medications  . cyclobenzaprine (FLEXERIL) 5 MG tablet    Sig: Take 1 tablet (5 mg total) by mouth 2 (two) times daily as needed for muscle spasms.    Dispense:  60 tablet    Refill:  0  . predniSONE (DELTASONE) 20 MG tablet    Sig: 60 mg x3d, 40 mg x3d, 20 mg x2d, 10 mg x2d    Dispense:  18 tablet    Refill:  0  . naproxen (NAPROSYN) 500 MG tablet    Sig: Take 1 tablet (500 mg total) by mouth 2 (two) times daily with a meal.    Dispense:  30 tablet    Refill:  0   Referral Orders  No referral(s) requested today     Note is dictated utilizing voice recognition software. Although note has been proof read prior to signing, occasional typographical errors still can be missed. If any questions arise, please do not hesitate to call for verification.   electronically signed by:  Felix Pacini, DO  [facility] [place]

## 2020-07-10 DIAGNOSIS — Z1231 Encounter for screening mammogram for malignant neoplasm of breast: Secondary | ICD-10-CM

## 2020-07-21 ENCOUNTER — Other Ambulatory Visit: Payer: 59

## 2021-03-10 ENCOUNTER — Other Ambulatory Visit: Payer: Self-pay | Admitting: Family Medicine

## 2021-03-10 DIAGNOSIS — Z1231 Encounter for screening mammogram for malignant neoplasm of breast: Secondary | ICD-10-CM

## 2021-03-26 ENCOUNTER — Other Ambulatory Visit: Payer: Self-pay

## 2021-03-26 ENCOUNTER — Ambulatory Visit
Admission: RE | Admit: 2021-03-26 | Discharge: 2021-03-26 | Disposition: A | Discharge status: Home / Self Care | Payer: 59 | Source: Ambulatory Visit

## 2021-03-26 DIAGNOSIS — Z1231 Encounter for screening mammogram for malignant neoplasm of breast: Secondary | ICD-10-CM

## 2021-04-15 ENCOUNTER — Other Ambulatory Visit: Payer: Self-pay

## 2021-04-16 ENCOUNTER — Encounter: Payer: Self-pay | Admitting: Family Medicine

## 2021-04-16 ENCOUNTER — Ambulatory Visit: Payer: 59 | Admitting: Family Medicine

## 2021-04-16 VITALS — BP 98/62 | HR 76 | Temp 98.4°F | Ht 60.0 in | Wt 145.0 lb

## 2021-04-16 DIAGNOSIS — N951 Menopausal and female climacteric states: Secondary | ICD-10-CM | POA: Diagnosis not present

## 2021-04-16 DIAGNOSIS — N393 Stress incontinence (female) (male): Secondary | ICD-10-CM

## 2021-04-16 DIAGNOSIS — N912 Amenorrhea, unspecified: Secondary | ICD-10-CM

## 2021-04-16 DIAGNOSIS — R232 Flushing: Secondary | ICD-10-CM | POA: Diagnosis not present

## 2021-04-16 NOTE — Patient Instructions (Signed)
Dysfunctional Uterine Bleeding Dysfunctional uterine bleeding is abnormal bleeding from the uterus. Dysfunctional uterine bleeding includes: A menstrual period that comes earlier or later than usual. A menstrual period that is lighter or heavier than usual, or has large blood clots. Vaginal bleeding between menstrual periods. Skipping one or more menstrual periods. Vaginal bleeding after sex. Vaginal bleeding after menopause. Follow these instructions at home: Eating and drinking  Eat well-balanced meals. Include foods that are high in iron, such as liver, meat, shellfish, green leafy vegetables, and eggs. To prevent or treat constipation, your health care provider may recommend that you: Drink enough fluid to keep your urine pale yellow. Take over-the-counter or prescription medicines. Eat foods that are high in fiber, such as beans, whole grains, and fresh fruits and vegetables. Limit foods that are high in fat and processed sugars, such as fried or sweet foods. Medicines Take over-the-counter and prescription medicines only as told by your health care provider. Do not change medicines without talking with your health care provider. Aspirin or medicines that contain aspirin may make the bleeding worse. Do not take those medicines: During the week before your menstrual period. During your menstrual period. If you were prescribed iron pills, take them as told by your health care provider. Iron pills help to replace iron that your body loses because of this condition. Activity If you need to change your sanitary pad or tampon more than one time every 2 hours: Lie in bed with your feet raised (elevated). Place a cold pack on your lower abdomen. Rest as much as possible until the bleeding stops or slows down. Do not try to lose weight until the bleeding has stopped and your blood iron level is back to normal. General instructions  For two months, write down: When your menstrual period  starts. When your menstrual period ends. When any abnormal vaginal bleeding occurs. What problems you notice. Keep all follow up visits as told by your health care provider. This is important. Contact a health care provider if you: Feel light-headed or weak. Have nausea and vomiting. Cannot eat or drink without vomiting. Feel dizzy or have diarrhea while you are taking medicines. Are taking birth control pills or hormones, and you want to change them or stop taking them. Get help right away if: You develop a fever or chills. You need to change your sanitary pad or tampon more than one time per hour. Your vaginal bleeding becomes heavier, or your flow contains clots more often. You develop pain in your abdomen. You lose consciousness. You develop a rash. Summary Dysfunctional uterine bleeding is abnormal bleeding from the uterus. It includes menstrual bleeding of abnormal duration, volume, or regularity. Bleeding after sex and after menopause are also considered dysfunctional uterine bleeding. This information is not intended to replace advice given to you by your health care provider. Make sure you discuss any questions you have with your health care provider. Document Revised: 11/29/2017 Document Reviewed: 11/29/2017 Elsevier Patient Education  2022 Elsevier Inc.  Perimenopause Perimenopause is the normal time of a woman's life when the levels of estrogen, the female hormone produced by the ovaries, begin to decrease. This leads to changes in menstrual periods before they stop completely (menopause). Perimenopause can begin 2-8 years before menopause. During perimenopause, the ovaries may or may not produce an egg and a woman can still become pregnant. What are the causes? This condition is caused by a natural change in hormone levels that happens as you get older. What increases the  risk? This condition is more likely to start at an earlier age if you have certain medical conditions  or have undergone treatments, including: A tumor of the pituitary gland in the brain. A disease that affects the ovaries and hormone production. Certain cancer treatments, such as chemotherapy or hormone therapy, or radiation therapy on the pelvis. Heavy smoking and excessive alcohol use. Family history of early menopause. What are the signs or symptoms? Perimenopausal changes affect each woman differently. Symptoms of this condition may include: Hot flashes. Irregular menstrual periods. Night sweats. Changes in feelings about sex. This could be a decrease in sex drive or an increased discomfort around your sexuality. Vaginal dryness. Headaches. Mood swings. Depression. Problems sleeping (insomnia). Memory problems or trouble concentrating. Irritability. Tiredness. Weight gain. Anxiety. Trouble getting pregnant. How is this diagnosed? This condition is diagnosed based on your medical history, a physical exam, your age, your menstrual history, and your symptoms. Hormone tests may also be done. How is this treated? In some cases, no treatment is needed. You and your health care provider should make a decision together about whether treatment is necessary. Treatment will be based on your individual condition and preferences. Various treatments are available, such as: Menopausal hormone therapy (MHT). Medicines to treat specific symptoms. Acupuncture. Vitamin or herbal supplements. Before starting treatment, make sure to let your health care provider know if you have a personal or family history of: Heart disease. Breast cancer. Blood clots. Diabetes. Osteoporosis. Follow these instructions at home: Medicines Take over-the-counter and prescription medicines only as told by your health care provider. Take vitamin supplements only as told by your health care provider. Talk with your health care provider before starting any herbal supplements. Lifestyle  Do not use any products  that contain nicotine or tobacco, such as cigarettes, e-cigarettes, and chewing tobacco. If you need help quitting, ask your health care provider. Get at least 30 minutes of physical activity on 5 or more days each week. Eat a balanced diet that includes fresh fruits and vegetables, whole grains, soybeans, eggs, lean meat, and low-fat dairy. Avoid alcoholic and caffeinated beverages, as well as spicy foods. This may help prevent hot flashes. Get 7-8 hours of sleep each night. Dress in layers that can be removed to help you manage hot flashes. Find ways to manage stress, such as deep breathing, meditation, or journaling. General instructions  Keep track of your menstrual periods, including: When they occur. How heavy they are and how long they last. How much time passes between periods. Keep track of your symptoms, noting when they start, how often you have them, and how long they last. Use vaginal lubricants or moisturizers to help with vaginal dryness and improve comfort during sex. You can still become pregnant if you are having irregular periods. Make sure you use contraception during perimenopause if you do not want to get pregnant. Keep all follow-up visits. This is important. This includes any group therapy or counseling. Contact a health care provider if: You have heavy vaginal bleeding or pass blood clots. Your period lasts more than 2 days longer than normal. Your periods are recurring sooner than 21 days. You bleed after having sex. You have pain during sex. Get help right away if you have: Chest pain, trouble breathing, or trouble talking. Severe depression. Pain when you urinate. Severe headaches. Vision problems. Summary Perimenopause is the time when a woman's body begins to move into menopause. This may happen naturally or as a result of other health problems or  medical treatments. Perimenopause can begin 2-8 years before menopause, and it can last for several  years. Perimenopausal symptoms can be managed through medicines, lifestyle changes, and complementary therapies such as acupuncture. This information is not intended to replace advice given to you by your health care provider. Make sure you discuss any questions you have with your health care provider. Document Revised: 12/05/2019 Document Reviewed: 12/05/2019 Elsevier Patient Education  2022 [employer].

## 2021-04-16 NOTE — Progress Notes (Signed)
This visit occurred during the SARS-CoV-2 public health emergency.  Safety protocols were in place, including screening questions prior to the visit, additional usage of staff PPE, and extensive cleaning of exam room while observing appropriate contact time as indicated for disinfecting solutions.    Kelly Duncan , 1975/08/14, 45 y.o., female MRN: [id number] Patient Care Team    Relationship Specialty Notifications Start End  Natalia Leatherwood, DO PCP - General Family Medicine  12/26/17     Chief Complaint  Patient presents with   Amenorrhea    X 7 mos; denies pain but has had some urinary incontinence      Subjective: Pt presents for an OV with complaints of amenorrhea of approximately 7 months.  Patient has had irregular menses for about 2 years.  She does endorse hot flashes on occasions and vaginal dryness.  She reports she sometimes have urinary incontinence, mostly when she sneezes, laughs or coughs.  She reports she had an ablation over 15 years ago, however she still had routine menstrual cycles until about a year and a half 2 years ago she started noting changes in her cycles.  Changes for started with dysfunctional uterine bleeding, absence of menses for 4 months, then her menses restarted and was normal for a few months, until the last several months it has been absent.  She reports her mother went through menopause in her 56s.  Depression screen [facility] 2/9 04/16/2021 06/03/2020 01/29/2019 12/26/2017  Decreased Interest 0 0 0 0  Down, Depressed, Hopeless 0 0 0 0  PHQ - 2 Score 0 0 0 0    No Known Allergies Social History   Social History Narrative   Married.  Some college.  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   Smoke alarm in the home.   Wears her seatbelt.   Feels safe in her relationships.   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   Vitamin B deficiency 09/08/2015   Vitamin D deficiency 09/08/2015   Past Surgical History:  Procedure Laterality Date   DILATION AND CURETTAGE OF UTERUS     ENDOMETRIAL ABLATION  05/2006   ESOPHAGOGASTRODUODENOSCOPY  01/18/2016   Out esophagitis.  Meprazole 20 mg daily recommended.   TUBAL LIGATION  2006   Family History  Problem Relation Age of Onset   Hypertension Mother    Hyperlipidemia Mother    Thyroid disease Mother    Arthritis Mother    Diabetes Mother    Hyperlipidemia Father    Prostate cancer Father    Hearing loss Father    Hypertension Father    Heart disease Father    Hyperlipidemia Sister    Diabetes Sister    Arthritis Maternal Grandmother    Diabetes Maternal Grandmother  Hyperlipidemia Maternal Grandmother    Hypertension Maternal Grandmother    Stroke Maternal Grandmother    Diabetes Maternal Grandfather    Hyperlipidemia Sister    Breast cancer Cousin    Breast cancer Paternal Aunt    Allergies as of 04/16/2021   No Known Allergies      Medication List        Accurate as of April 16, 2021 11:59 PM. If you have any questions, ask your nurse or doctor.          STOP taking these medications    naproxen 500 MG tablet Commonly known as: Naprosyn Stopped by: Felix Pacini, DO       TAKE these medications    ciprofloxacin 500 MG tablet Commonly known as: CIPRO Take by mouth.    cyclobenzaprine 5 MG tablet Commonly known as: FLEXERIL Take 1 tablet (5 mg total) by mouth 2 (two) times daily as needed for muscle spasms.   ibuprofen 200 MG tablet Commonly known as: ADVIL Take 200 mg by mouth as needed.   predniSONE 20 MG tablet Commonly known as: DELTASONE 60 mg x3d, 40 mg x3d, 20 mg x2d, 10 mg x2d        All past medical history, surgical history, allergies, family history, immunizations andmedications were updated in the EMR today and reviewed under the history and medication portions of their EMR.     ROS: Negative, with the exception of above mentioned in HPI   Objective:  BP 98/62   Pulse 76   Temp 98.4 F (36.9 C) (Oral)   Ht 5' (1.524 m)   Wt 145 lb (65.8 kg)   LMP 08/26/2020 (Approximate)   SpO2 97%   BMI 28.32 kg/m  Body mass index is 28.32 kg/m. Gen: Afebrile. No acute distress. Nontoxic in appearance, well developed, well nourished.  Very pleasant female HENT: AT. [place].  No cough or shortness of breath Eyes:Pupils Equal Round Reactive to light, Extraocular movements intact,  Conjunctiva without redness, discharge or icterus. Neck/lymp/endocrine: Supple, no lymphadenopathy, no thyromegaly Abd: Soft.. NTND. BS present.  No masses palpated. No rebound or guarding.  Neuro:  Normal gait. PERLA. EOMi. Alert. Oriented x3  Psych: Normal affect, dress and demeanor. Normal speech. Normal thought content and judgment.  No results found. No results found. No results found for this or any previous visit (from the past 24 hour(s)).  Assessment/Plan: Kelly Duncan is a 45 y.o. female present for OV for  Amenorrhea/hot flashes/perimenopause Discussed her symptoms sound perimenopausal.  We discussed definition of menopause being no menstrual cycle for at least 1 year with changes in her hormones to suggest menopausal state. Agreed to check hormone levels today as well as rule out thyroid disorder as cause. She is also noticing increase in stress  incontinence.  We briefly discussed treatments of stress incontinence and she would like to be referred to gynecology to discuss further today. - TSH - Estradiol - FSH/LH - Prolactin - POCT urine pregnancy - Ambulatory referral to Gynecology  Stress incontinence - Ambulatory referral to Gynecology  Reviewed expectations re: course of current medical issues. Discussed self-management of symptoms. Outlined signs and symptoms indicating need for more acute intervention. Patient verbalized understanding and all questions were answered. Patient received an After-Visit Summary.    Orders Placed This Encounter  Procedures   TSH   Estradiol   FSH/LH   Prolactin   POCT urine pregnancy   No orders of the defined types were placed in this encounter.  Referral Orders  No referral(s) requested today     Note is dictated utilizing voice recognition software. Although note has been proof read prior to signing, occasional typographical errors still can be missed. If any questions arise, please do not hesitate to call for verification.   electronically signed by:  Howard Pouch, DO  [place]

## 2021-04-17 LAB — ESTRADIOL: Estradiol: 217 pg/mL

## 2021-04-17 LAB — FSH/LH
FSH: 31.2 m[IU]/mL
LH: 55.9 m[IU]/mL

## 2021-04-17 LAB — PROLACTIN: Prolactin: 33.2 ng/mL — ABNORMAL HIGH

## 2021-04-17 LAB — TSH: TSH: 1.07 mIU/L

## 2021-04-19 ENCOUNTER — Telehealth: Payer: Self-pay | Admitting: Family Medicine

## 2021-04-19 DIAGNOSIS — N912 Amenorrhea, unspecified: Secondary | ICD-10-CM | POA: Insufficient documentation

## 2021-04-19 DIAGNOSIS — N951 Menopausal and female climacteric states: Secondary | ICD-10-CM | POA: Insufficient documentation

## 2021-04-19 DIAGNOSIS — N393 Stress incontinence (female) (male): Secondary | ICD-10-CM | POA: Insufficient documentation

## 2021-04-19 DIAGNOSIS — R232 Flushing: Secondary | ICD-10-CM | POA: Insufficient documentation

## 2021-04-19 NOTE — Telephone Encounter (Signed)
LVM for pt to CB regarding results.  

## 2021-04-19 NOTE — Telephone Encounter (Signed)
Spoke with pt regarding results/recommendations,voiced understanding. ? ?

## 2021-04-19 NOTE — Telephone Encounter (Signed)
Please inform patient the following information: Thyroid function is normal. Estrogen levels are normal. Her FSH and LH are elevated in the perimenopausal/postmenopausal levels.  Due to her hormone levels, she is at least in perimenopausal state.  No further work-up needed at this time unless she would like to see a gynecologist we may place that referral for her.

## 2021-04-21 ENCOUNTER — Telehealth: Payer: Self-pay | Admitting: Family Medicine

## 2021-04-21 DIAGNOSIS — R7989 Other specified abnormal findings of blood chemistry: Secondary | ICD-10-CM

## 2021-04-21 NOTE — Telephone Encounter (Signed)
LVM for pt to CB regarding results.  

## 2021-04-21 NOTE — Telephone Encounter (Signed)
Please call patient Her prolactin level has resulted and it is just mildly elevated above goal for her perimenopausal/postmenopausal condition.  Since it is in the equivocal range, recommendations are to repeat this level in 1-2 weeks. High prolactin levels can cause changes in menstrual cycles, galactorrhea which is discharge from the breast and is caused from a condition with the pituitary gland if present. I have placed the future lab order in for her.  Does not have to be fasting.  Can be anytime of the day.

## 2021-04-22 NOTE — Telephone Encounter (Signed)
LVM for pt to CB regarding results.  

## 2021-04-23 NOTE — Telephone Encounter (Signed)
LVM for pt to CB regarding results.  

## 2021-04-23 NOTE — Telephone Encounter (Signed)
Letter sent.

## 2021-04-23 NOTE — Telephone Encounter (Signed)
Pt returned call and advised of results. Lab visit scheduled 10/26

## 2021-04-24 ENCOUNTER — Encounter: Payer: Self-pay | Admitting: Family Medicine

## 2021-04-28 ENCOUNTER — Ambulatory Visit (INDEPENDENT_AMBULATORY_CARE_PROVIDER_SITE_OTHER): Payer: 59

## 2021-04-28 ENCOUNTER — Other Ambulatory Visit: Payer: Self-pay

## 2021-04-28 DIAGNOSIS — R7989 Other specified abnormal findings of blood chemistry: Secondary | ICD-10-CM | POA: Diagnosis not present

## 2021-04-28 NOTE — Progress Notes (Signed)
Per the otrders of Dr. Claiborne Billings pt is here for labs, pt tolerated draw well.

## 2021-04-29 LAB — PROLACTIN: Prolactin: 9.8 ng/mL

## 2021-07-15 ENCOUNTER — Telehealth: Payer: 59 | Admitting: Physician Assistant

## 2021-07-15 DIAGNOSIS — R197 Diarrhea, unspecified: Secondary | ICD-10-CM

## 2021-07-15 MED ORDER — ONDANSETRON HCL 4 MG PO TABS: 4.0000 mg | ORAL_TABLET | Freq: Three times a day (TID) | ORAL | 0 refills | Status: DC | PRN

## 2021-07-15 NOTE — Progress Notes (Signed)
We are sorry that you are not feeling well.  Here is how we plan to help!  Based on what you have shared with me it looks like you have Acute Infectious Diarrhea.  Most cases of acute diarrhea are due to infections with virus and bacteria and are self-limited conditions lasting less than 14 days.  For your symptoms you may take Imodium 2 mg tablets that are over the counter at your local pharmacy. Take two tablet now and then one after each loose stool up to 6 a day.  Antibiotics are not needed for most people with diarrhea.  Optional: Zofran 4 mg 1 tablet every 8 hours as needed for nausea and vomiting   HOME CARE We recommend changing your diet to help with your symptoms for the next few days. Drink plenty of fluids that contain water salt and sugar. Sports drinks such as Gatorade may help.  You may try broths, soups, bananas, applesauce, soft breads, mashed potatoes or crackers.  You are considered infectious for as long as the diarrhea continues. Hand washing or use of alcohol based hand sanitizers is recommend. It is best to stay out of work or school until your symptoms stop.   GET HELP RIGHT AWAY If you have dark yellow colored urine or do not pass urine frequently you should drink more fluids.   If your symptoms worsen  If you feel like you are going to pass out (faint) You have a new problem  MAKE SURE YOU  Understand these instructions. Will watch your condition. Will get help right away if you are not doing well or get worse.  Thank you for choosing an e-visit.  Your e-visit answers were reviewed by a board certified advanced clinical practitioner to complete your personal care plan. Depending upon the condition, your plan could have included both over the counter or prescription medications.  Please review your pharmacy choice. Make sure the pharmacy is open so you can pick up prescription now. If there is a problem, you may contact your provider through MyChart  messaging and have the prescription routed to another pharmacy.  Your safety is important to us. If you have drug allergies check your prescription carefully.   For the next 24 hours you can use MyChart to ask questions about today's visit, request a non-urgent call back, or ask for a work or school excuse. You will get an email in the next two days asking about your experience. I hope that your e-visit has been valuable and will speed your recovery.  I provided 5 minutes of non face-to-face time during this encounter for chart review and documentation.    

## 2022-04-27 ENCOUNTER — Ambulatory Visit: Payer: 59 | Admitting: Family Medicine

## 2022-04-27 ENCOUNTER — Encounter: Payer: Self-pay | Admitting: Family Medicine

## 2022-04-27 VITALS — BP 105/70 | HR 75 | Temp 98.0°F | Wt 150.6 lb

## 2022-04-27 DIAGNOSIS — L989 Disorder of the skin and subcutaneous tissue, unspecified: Secondary | ICD-10-CM

## 2022-04-27 DIAGNOSIS — N393 Stress incontinence (female) (male): Secondary | ICD-10-CM | POA: Diagnosis not present

## 2022-04-27 NOTE — Progress Notes (Signed)
Kelly Duncan , Apr 02, 1976, 46 y.o., female MRN: [id number] Patient Care Team    Relationship Specialty Notifications Start End  Ma Hillock, DO PCP - General Family Medicine  12/26/17     Chief Complaint  Patient presents with   bruise    Noticed about a month ago. Thinks it is getting bigger     Subjective: Pt presents for an OV with concerns today. Skin change: Patient states she noticed about a month ago a dark spot under her left arm in her armpit.  She denies any tenderness.  She denies any known injury.  She wants to ensure it is not a skin lesion/cancer. Urinary incontinence: Patient reports she has had urinary incontinence for a long time.  She feels is getting worse over the last couple months.  She states the urinary incontinence occurs when she sneezes, coughs or laughs vigorously.  She is having to wear a pad daily now.     04/16/2021    3:43 PM 06/03/2020    1:53 PM 01/29/2019    2:00 PM 12/26/2017    3:16 PM  Depression screen PHQ 2/9  Decreased Interest 0 0 0 0  Down, Depressed, Hopeless 0 0 0 0  PHQ - 2 Score 0 0 0 0    No Known Allergies Social History   Social History Narrative   Married.  Some college.  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   Moved from [place] April 2019.   Smoke alarm in the home.   Wears her seatbelt.   Feels safe in her relationships.   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   Vitamin B deficiency 09/08/2015   Vitamin D deficiency 09/08/2015   Past Surgical History:  Procedure Laterality Date   DILATION AND CURETTAGE OF UTERUS     ENDOMETRIAL ABLATION  05/2006   ESOPHAGOGASTRODUODENOSCOPY  01/18/2016   Out esophagitis.  Meprazole 20 mg daily recommended.   TUBAL LIGATION  2006   Family History  Problem Relation Age of Onset   Hypertension Mother    Hyperlipidemia Mother    Thyroid disease Mother    Arthritis Mother    Diabetes Mother    Hyperlipidemia Father    Prostate cancer Father    Hearing loss Father    Hypertension Father    Heart disease Father    Hyperlipidemia Sister    Diabetes Sister    Arthritis Maternal Grandmother    Diabetes Maternal Grandmother    Hyperlipidemia Maternal Grandmother    Hypertension Maternal Grandmother    Stroke Maternal Grandmother    Diabetes Maternal Grandfather    Hyperlipidemia Sister    Breast cancer Cousin    Breast cancer Paternal Aunt    Allergies as of 04/27/2022   No Known Allergies  Medication List        Accurate as of April 27, 2022  5:12 PM. If you have any questions, ask your nurse or doctor.          ibuprofen 200 MG tablet Commonly known as: ADVIL Take 200 mg by mouth as needed.   ondansetron 4 MG tablet Commonly known as: Zofran Take 1 tablet (4 mg total) by mouth every 8 (eight) hours as needed for nausea or vomiting.        All past medical history, surgical history, allergies, family history, immunizations andmedications were updated in the EMR today and reviewed under the history and medication portions of their EMR.     ROS Negative, with the exception of above mentioned in HPI   Objective:  BP 105/70   Pulse 75   Temp 98 F (36.7 C)   Wt 150 lb 9.6 oz (68.3 kg)   SpO2 97%   BMI 29.41 kg/m  Body mass index is 29.41 kg/m. Physical Exam Vitals  and nursing note reviewed.  Constitutional:      General: She is not in acute distress.    Appearance: Normal appearance. She is normal weight. She is not ill-appearing or toxic-appearing.  Eyes:     Extraocular Movements: Extraocular movements intact.     Conjunctiva/sclera: Conjunctivae normal.     Pupils: Pupils are equal, round, and reactive to light.  Skin:    Findings: Lesion (Proximately 2 mm of mild hyperpigmentation that is uniform in nature, flat.) present.  Neurological:     Mental Status: She is alert and oriented to person, place, and time. Mental status is at baseline.  Psychiatric:        Mood and Affect: Mood normal.        Behavior: Behavior normal.        Thought Content: Thought content normal.        Judgment: Judgment normal.     No results found. No results found. No results found for this or any previous visit (from the past 24 hour(s)).  Assessment/Plan: Kelly Duncan is a 46 y.o. female present for OV for  Stress incontinence Gust the different types of urinary incontinence today along her symptoms seem to be consistent with stress incontinence. We discussed Kegel exercises for pelvic floor strengthening. We discussed gynecology referral to evaluate and recommend treatment plan. Briefly discussed pessaries versus bladder slings etc. - Ambulatory referral to Gynecology  Skin lesion Assured patient.  Area of hyperpigmentation appears to be scarring.  Not a true skin lesion I would be concerning for skin cancer.  Reviewed expectations re: course of current medical issues. Discussed self-management of symptoms. Outlined signs and symptoms indicating need for more acute intervention. Patient verbalized understanding and all questions were answered. Patient received an After-Visit Summary.    Orders Placed This Encounter  Procedures   Ambulatory referral to Gynecology   No orders of the defined types were placed in this encounter.  Referral  Orders         Ambulatory referral to Gynecology       Note is dictated utilizing voice recognition software. Although note has been proof read prior to signing, occasional typographical errors still can be missed. If any questions arise, please do not hesitate to call for verification.   electronically signed by:  Howard Pouch, DO  [facility]

## 2022-04-30 ENCOUNTER — Other Ambulatory Visit: Payer: Self-pay | Admitting: Family Medicine

## 2022-04-30 ENCOUNTER — Ambulatory Visit
Admission: RE | Admit: 2022-04-30 | Discharge: 2022-04-30 | Disposition: A | Discharge status: Home / Self Care | Payer: 59 | Source: Ambulatory Visit | Attending: Family Medicine | Admitting: Family Medicine

## 2022-04-30 VITALS — BP 110/74 | HR 72 | Temp 98.8°F | Resp 18 | Ht 60.0 in | Wt 150.0 lb

## 2022-04-30 DIAGNOSIS — N3090 Cystitis, unspecified without hematuria: Secondary | ICD-10-CM | POA: Diagnosis not present

## 2022-04-30 DIAGNOSIS — Z1231 Encounter for screening mammogram for malignant neoplasm of breast: Secondary | ICD-10-CM

## 2022-04-30 LAB — POCT URINALYSIS DIP (MANUAL ENTRY)
Bilirubin, UA: NEGATIVE
Glucose, UA: NEGATIVE mg/dL
Ketones, POC UA: NEGATIVE mg/dL
Nitrite, UA: NEGATIVE
Protein Ur, POC: NEGATIVE mg/dL
Spec Grav, UA: 1.02 (ref 1.010–1.025)
Urobilinogen, UA: 0.2 E.U./dL
pH, UA: 7 (ref 5.0–8.0)

## 2022-04-30 MED ORDER — PHENAZOPYRIDINE HCL 200 MG PO TABS: 200.0000 mg | ORAL_TABLET | Freq: Three times a day (TID) | ORAL | 0 refills | Status: DC

## 2022-04-30 MED ORDER — NITROFURANTOIN MONOHYD MACRO 100 MG PO CAPS: 100.0000 mg | ORAL_CAPSULE | Freq: Two times a day (BID) | ORAL | 0 refills | Status: DC

## 2022-04-30 NOTE — ED Triage Notes (Signed)
Patient c/o possible UTI, foul smelling urine, lower abdominal cramping, low back pain, urinary urgency x 1 week.  Patient has taken Ibuprofen for pain.

## 2022-04-30 NOTE — Discharge Instructions (Signed)
Drink lots of water Take the antibiotic 2 x a day Take the pyridium as needed urinary pain The urine has been sent to the lab for culture.  You will be called if a change in antibiotic is needed

## 2022-04-30 NOTE — ED Provider Notes (Signed)
[facility]    CSN: [id number] Arrival date & time: 04/30/22  0911      History   Chief Complaint Chief Complaint  Patient presents with   Urinary urgency    HPI Kelly Duncan is a 46 y.o. female.   HPI  Patient states she has some lower abdominal crampy pain.  Vague low back ache.  Urinary frequency and dysuria.  This has been going on for about a week.  States that her urine smells "strong".  Does not have a history of recurring UTI  Past Medical History:  Diagnosis Date   Cervical radiculopathy at C7 2017   Chicken pox    Constipation    Dysmenorrhea    Endometriosis    Frequent headaches    Ganglion cyst of finger of right hand 11/22/2014   Hepatitis A    childhood   Left cervical lymphadenopathy 09/18/2015   benign appearing [place]   Plantar fasciitis    Positive ANA (antinuclear antibody) 03/02/2015   Negative anti--DNA screen, negative intrinsic factor antibody, normal sed rate, normal CRP, normal SCL-70 antibody, normal Jo 1 antibody, centromere antibody, normal histone antibody   Spondylosis of cervical spine 12/19/2016   CT: Multilevel spondylosis with mild to moderate canal stenosis at C5-C6.   Vitamin B deficiency 09/08/2015   Vitamin D deficiency 09/08/2015    Patient Active Problem List   Diagnosis Date Noted   Stress incontinence 04/19/2021   Amenorrhea 04/19/2021   Perimenopause 04/19/2021   Hot flashes 04/19/2021   Ganglion cyst 06/03/2020   Cervical radiculopathy 06/03/2020   Cervical stenosis of spine 06/03/2020   Dysmenorrhea 01/19/2020   DUB (dysfunctional uterine bleeding) 08/30/2019   Numbness and tingling of both upper extremities 08/30/2019   Numbness and tingling of both lower extremities 08/30/2019   Night sweats 08/30/2019   Vertigo 08/30/2019   Abnormal mammogram of left breast 04/16/2019   Hypertriglyceridemia 02/12/2019   De Quervain's tenosynovitis, right 02/12/2019   Overweight (BMI 25.0-29.9) 01/29/2019    Encounter for routine gynecological examination with Papanicolaou smear of cervix 12/26/2017    Past Surgical History:  Procedure Laterality Date   DILATION AND CURETTAGE OF UTERUS     ENDOMETRIAL ABLATION  05/2006   ESOPHAGOGASTRODUODENOSCOPY  01/18/2016   Out esophagitis.  Meprazole 20 mg daily recommended.   TUBAL LIGATION  2006    OB History     Gravida  3   Para  2   Term      Preterm      AB      Living  2      SAB      IAB      Ectopic      Multiple      Live Births               Home Medications    Prior to Admission medications   Medication Sig Start Date End Date Taking? Authorizing Provider  ibuprofen (ADVIL) 200 MG tablet Take 200 mg by mouth as needed.   Yes [provider]  nitrofurantoin, macrocrystal-monohydrate, (MACROBID) 100 MG capsule Take 1 capsule (100 mg total) by mouth 2 (two) times daily. 04/30/22  Yes Eustace Moore, MD  phenazopyridine (PYRIDIUM) 200 MG tablet Take 1 tablet (200 mg total) by mouth 3 (three) times daily. 04/30/22  Yes Eustace Moore, MD    Family History Family History  Problem Relation Age of Onset   Hypertension Mother    Hyperlipidemia Mother  Thyroid disease Mother    Arthritis Mother    Diabetes Mother    Hyperlipidemia Father    Prostate cancer Father    Hearing loss Father    Hypertension Father    Heart disease Father    Hyperlipidemia Sister    Diabetes Sister    Arthritis Maternal Grandmother    Diabetes Maternal Grandmother    Hyperlipidemia Maternal Grandmother    Hypertension Maternal Grandmother    Stroke Maternal Grandmother    Diabetes Maternal Grandfather    Hyperlipidemia Sister    Breast cancer Cousin    Breast cancer Paternal Aunt     Social History Social History   Tobacco Use   Smoking status: Never   Smokeless tobacco: Never  Vaping Use   Vaping Use: Never used  Substance Use Topics   Alcohol use: Never   Drug use: Never     Allergies    Patient has no known allergies.   Review of Systems Review of Systems  See HPI Physical Exam Triage Vital Signs ED Triage Vitals  Enc Vitals Group     BP 04/30/22 0921 110/74     Pulse Rate 04/30/22 0921 72     Resp 04/30/22 0921 18     Temp 04/30/22 0921 98.8 F (37.1 C)     Temp Source 04/30/22 0921 Oral     SpO2 04/30/22 0921 97 %     Weight 04/30/22 0922 150 lb (68 kg)     Height 04/30/22 0922 5' (1.524 m)     Head Circumference --      Peak Flow --      Pain Score 04/30/22 0922 8     Pain Loc --      Pain Edu? --      Excl. in GC? --    No data found.  Updated Vital Signs BP 110/74 (BP Location: Right Arm)   Pulse 72   Temp 98.8 F (37.1 C) (Oral)   Resp 18   Ht 5' (1.524 m)   Wt 68 kg   SpO2 97%   BMI 29.29 kg/m      Physical Exam Constitutional:      General: She is not in acute distress.    Appearance: She is well-developed.  HENT:     Head: Normocephalic and atraumatic.  Eyes:     Conjunctiva/sclera: Conjunctivae normal.     Pupils: Pupils are equal, round, and reactive to light.  Cardiovascular:     Rate and Rhythm: Normal rate.  Pulmonary:     Effort: Pulmonary effort is normal. No respiratory distress.  Abdominal:     General: There is no distension.     Palpations: Abdomen is soft.     Tenderness: There is no abdominal tenderness. There is no right CVA tenderness or left CVA tenderness.  Musculoskeletal:        General: Normal range of motion.     Cervical back: Normal range of motion.  Skin:    General: Skin is warm and dry.  Neurological:     Mental Status: She is alert.      UC Treatments / Results  Labs (all labs ordered are listed, but only abnormal results are displayed) Labs Reviewed  POCT URINALYSIS DIP (MANUAL ENTRY) - Abnormal; Notable for the following components:      Result Value   Clarity, UA cloudy (*)    Blood, UA small (*)    Leukocytes, UA Small (1+) (*)    All  other components within normal limits  URINE  CULTURE    EKG   Radiology No results found.  Procedures Procedures (including critical care time)  Medications Ordered in UC Medications - No data to display  Initial Impression / Assessment and Plan / UC Course  I have reviewed the triage vital signs and the nursing notes.  Pertinent labs & imaging results that were available during my care of the patient were reviewed by me and considered in my medical decision making (see chart for details).      Final Clinical Impressions(s) / UC Diagnoses   Final diagnoses:  Cystitis     Discharge Instructions      Drink lots of water Take the antibiotic 2 x a day Take the pyridium as needed urinary pain The urine has been sent to the lab for culture.  You will be called if a change in antibiotic is needed   ED Prescriptions     Medication Sig Dispense Auth. Provider   nitrofurantoin, macrocrystal-monohydrate, (MACROBID) 100 MG capsule Take 1 capsule (100 mg total) by mouth 2 (two) times daily. 10 capsule Raylene Everts, MD   phenazopyridine (PYRIDIUM) 200 MG tablet Take 1 tablet (200 mg total) by mouth 3 (three) times daily. 6 tablet Raylene Everts, MD      PDMP not reviewed this encounter.   Raylene Everts, MD 04/30/22 1058

## 2022-05-02 LAB — URINE CULTURE: Culture: >=100000 — AB

## 2022-05-11 ENCOUNTER — Ambulatory Visit
Admission: RE | Admit: 2022-05-11 | Discharge: 2022-05-11 | Disposition: A | Discharge status: Home / Self Care | Payer: 59 | Source: Ambulatory Visit

## 2022-05-11 DIAGNOSIS — Z1231 Encounter for screening mammogram for malignant neoplasm of breast: Secondary | ICD-10-CM

## 2022-05-17 ENCOUNTER — Encounter: Payer: 59 | Admitting: Family Medicine

## 2022-05-17 ENCOUNTER — Telehealth: Payer: Self-pay | Admitting: Family Medicine

## 2022-05-17 NOTE — Telephone Encounter (Signed)
Patient was returning call about her mammogram results. Patient was informed that her mammogram was negative.

## 2022-07-26 ENCOUNTER — Encounter: Payer: 59 | Admitting: Family Medicine

## 2022-08-03 ENCOUNTER — Encounter: Payer: 59 | Admitting: Family Medicine

## 2022-10-20 ENCOUNTER — Ambulatory Visit: Payer: 59 | Admitting: Family Medicine

## 2022-10-20 ENCOUNTER — Encounter: Payer: Self-pay | Admitting: Family Medicine

## 2022-10-20 VITALS — BP 113/73 | HR 72 | Temp 98.3°F | Ht 60.0 in | Wt 150.6 lb

## 2022-10-20 DIAGNOSIS — G5601 Carpal tunnel syndrome, right upper limb: Secondary | ICD-10-CM

## 2022-10-20 MED ORDER — TRAMADOL HCL 50 MG PO TABS: 50.0000 mg | ORAL_TABLET | Freq: Three times a day (TID) | ORAL | 0 refills | Status: DC | PRN

## 2022-10-20 NOTE — Progress Notes (Addendum)
OFFICE VISIT  10/20/2022  CC:  Chief Complaint  Patient presents with   Hand Pain    Right hand with swelling x 1 week. Started swelling last week. Denies injury. Has taken ibuprofen, last dose last night.     Patient is a 47 y.o. female who presents for right wrist and hand discomfort. HPI: Onset about a week ago.  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  The week prior to onset of her symptoms she did even more this than usual. She describes a uncomfortable sensation in the wrist that sometimes extends up the forearm and also down into the hand.  Describes feeling of numbness and tingling, "like it is asleep" that waxes and wanes in intensity but is significantly bothersome all the time.  She has not seen any swelling of her fingers or wrist.  She has symptoms occasionally similar to this in the left wrist but nothing lately. She does recall a history of carpal tunnel syndrome in the remote past--right side.    Past Medical History:  Diagnosis Date   Cervical radiculopathy at C7 2017   Chicken pox    Constipation    Dysmenorrhea    Endometriosis    Frequent headaches    Ganglion cyst of finger of right hand 11/22/2014   Hepatitis A    childhood   Left cervical lymphadenopathy 09/18/2015   benign appearing [place]   Plantar fasciitis    Positive ANA (antinuclear antibody) 03/02/2015   Negative anti--DNA screen, negative intrinsic factor antibody, normal sed rate, normal CRP, normal SCL-70 antibody, normal Jo 1 antibody, centromere antibody, normal histone antibody   Spondylosis of cervical spine 12/19/2016   CT: Multilevel spondylosis with mild to moderate canal stenosis at C5-C6.   Vitamin B deficiency 09/08/2015   Vitamin D deficiency 09/08/2015    Past Surgical History:  Procedure Laterality Date   DILATION AND CURETTAGE OF UTERUS     ENDOMETRIAL ABLATION  05/2006   ESOPHAGOGASTRODUODENOSCOPY  01/18/2016   Out esophagitis.  Meprazole 20 mg daily recommended.   TUBAL  LIGATION  2006    Outpatient Medications Prior to Visit  Medication Sig Dispense Refill   ibuprofen (ADVIL) 200 MG tablet Take 200 mg by mouth as needed.     nitrofurantoin, macrocrystal-monohydrate, (MACROBID) 100 MG capsule Take 1 capsule (100 mg total) by mouth 2 (two) times daily. (Patient not taking: Reported on 10/20/2022) 10 capsule 0   phenazopyridine (PYRIDIUM) 200 MG tablet Take 1 tablet (200 mg total) by mouth 3 (three) times daily. (Patient not taking: Reported on 10/20/2022) 6 tablet 0   No facility-administered medications prior to visit.    No Known Allergies  Review of Systems  As per HPI  PE:    10/20/2022    3:05 PM 04/30/2022    9:22 AM 04/30/2022    9:21 AM  Vitals with BMI  Height     Weight 150 lbs 10 oz 150 lbs   BMI 29.41 29.3   Systolic 113  110  Diastolic 73  74  Pulse 72  72     Physical Exam  General: Alert and well-appearing. Right wrist: No tenderness, erythema, warmth, or swelling.  Resisted wrist flexion and resisted pronation of the forearm produce some discomfort in the wrist area.  No weakness of wrist or finger strength.  No muscle atrophy. Neurovascularly intact. Phalen's positive.  Tinel's negative.  LABS:  Last CBC Lab Results  Component Value Date   WBC 7.9  08/28/2019   HGB 13.9 08/28/2019   HCT 42.1 08/28/2019   MCV 93.3 08/28/2019   MCH 30.8 08/28/2019   RDW 13.2 08/28/2019   PLT 286 08/28/2019   Last metabolic panel Lab Results  Component Value Date   GLUCOSE 70 08/28/2019   NA 138 08/28/2019   K 4.1 08/28/2019   CL 105 08/28/2019   CO2 25 08/28/2019   BUN 20 08/28/2019   CREATININE 0.78 08/28/2019   CALCIUM 9.4 08/28/2019   PROT 7.2 08/28/2019   ALBUMIN 4.2 01/29/2019   BILITOT 0.3 08/28/2019   ALKPHOS 65 01/29/2019   AST 17 08/28/2019   ALT 13 08/28/2019   Last hemoglobin A1c Lab Results  Component Value Date   HGBA1C 5.1 01/29/2019   IMPRESSION AND PLAN:  Carpal tunnel syndrome,  right. Conservative management with wrist splint at night recommended. She already takes 800 mg of ibuprofen twice a day for her chronic arthralgias. Tramadol 50 mg, 1 3 times daily as needed, #15 prescribed today.  No refill.  Musculoskeletal bedside ultrasound today of the right wrist showed median nerve area 0.12 cm at the level of the pisiform.  Notable waist sign in long axis.  Hypoechogenic median nerve changes present. Median nerve area at the level of the pronator teres was 0.8 cm. For comparison, left median nerve area 0.6 cm at the level of the pisiform.  If not improving significantly in 1 to 2 weeks with conservative management then she can return for steroid injection.  An After Visit Summary was printed and given to the patient.  FOLLOW UP: Return if symptoms worsen or fail to improve in 2 wks.  Signed:  Santiago Bumpers, MD           10/20/2022

## 2022-10-20 NOTE — Patient Instructions (Signed)

## 2022-11-11 ENCOUNTER — Encounter: Payer: 59 | Admitting: Family Medicine

## 2022-11-14 ENCOUNTER — Ambulatory Visit (INDEPENDENT_AMBULATORY_CARE_PROVIDER_SITE_OTHER): Payer: 59 | Admitting: Family Medicine

## 2022-11-14 ENCOUNTER — Encounter: Payer: Self-pay | Admitting: Family Medicine

## 2022-11-14 VITALS — BP 111/73 | HR 62 | Temp 98.0°F | Ht 59.45 in | Wt 149.6 lb

## 2022-11-14 DIAGNOSIS — Z131 Encounter for screening for diabetes mellitus: Secondary | ICD-10-CM | POA: Diagnosis not present

## 2022-11-14 DIAGNOSIS — Z0000 Encounter for general adult medical examination without abnormal findings: Secondary | ICD-10-CM

## 2022-11-14 DIAGNOSIS — Z1211 Encounter for screening for malignant neoplasm of colon: Secondary | ICD-10-CM | POA: Diagnosis not present

## 2022-11-14 DIAGNOSIS — Z1231 Encounter for screening mammogram for malignant neoplasm of breast: Secondary | ICD-10-CM | POA: Diagnosis not present

## 2022-11-14 DIAGNOSIS — Z1159 Encounter for screening for other viral diseases: Secondary | ICD-10-CM

## 2022-11-14 DIAGNOSIS — G5601 Carpal tunnel syndrome, right upper limb: Secondary | ICD-10-CM

## 2022-11-14 DIAGNOSIS — E781 Pure hyperglyceridemia: Secondary | ICD-10-CM

## 2022-11-14 LAB — LIPID PANEL
Cholesterol: 231 mg/dL — ABNORMAL HIGH (ref 0–200)
HDL: 40.3 mg/dL (ref >39.00)
NonHDL: 190.34
Total CHOL/HDL Ratio: 6
Triglycerides: 373 mg/dL — ABNORMAL HIGH (ref 0.0–149.0)
VLDL: 74.6 mg/dL — ABNORMAL HIGH (ref 0.0–40.0)

## 2022-11-14 LAB — CBC WITH DIFFERENTIAL/PLATELET
Basophils Absolute: 0 10*3/uL (ref 0.0–0.1)
Basophils Relative: 0.4 % (ref 0.0–3.0)
Eosinophils Absolute: 0.1 10*3/uL (ref 0.0–0.7)
Eosinophils Relative: 2.7 % (ref 0.0–5.0)
HCT: 43.5 % (ref 36.0–46.0)
Hemoglobin: 14.7 g/dL (ref 12.0–15.0)
Lymphocytes Relative: 40 % (ref 12.0–46.0)
Lymphs Abs: 2.2 10*3/uL (ref 0.7–4.0)
MCHC: 33.8 g/dL (ref 30.0–36.0)
MCV: 93 fl (ref 78.0–100.0)
Monocytes Absolute: 0.3 10*3/uL (ref 0.1–1.0)
Monocytes Relative: 5.1 % (ref 3.0–12.0)
Neutro Abs: 2.9 10*3/uL (ref 1.4–7.7)
Neutrophils Relative %: 51.8 % (ref 43.0–77.0)
Platelets: 241 10*3/uL (ref 150.0–400.0)
RBC: 4.68 Mil/uL (ref 3.87–5.11)
RDW: 13.1 % (ref 11.5–15.5)
WBC: 5.5 10*3/uL (ref 4.0–10.5)

## 2022-11-14 LAB — HEMOGLOBIN A1C: Hgb A1c MFr Bld: 5.3 % (ref 4.6–6.5)

## 2022-11-14 LAB — COMPREHENSIVE METABOLIC PANEL
ALT: 20 U/L (ref 0–35)
AST: 18 U/L (ref 0–37)
Albumin: 4.4 g/dL (ref 3.5–5.2)
Alkaline Phosphatase: 73 U/L (ref 39–117)
BUN: 15 mg/dL (ref 6–23)
CO2: 27 mEq/L (ref 19–32)
Calcium: 10 mg/dL (ref 8.4–10.5)
Chloride: 103 mEq/L (ref 96–112)
Creatinine, Ser: 0.78 mg/dL (ref 0.40–1.20)
GFR: 90.08 mL/min (ref >60.00)
Glucose, Bld: 88 mg/dL (ref 70–99)
Potassium: 4.3 mEq/L (ref 3.5–5.1)
Sodium: 139 mEq/L (ref 135–145)
Total Bilirubin: 0.6 mg/dL (ref 0.2–1.2)
Total Protein: 7.2 g/dL (ref 6.0–8.3)

## 2022-11-14 LAB — LDL CHOLESTEROL, DIRECT: Direct LDL: 120 mg/dL

## 2022-11-14 LAB — TSH: TSH: 1.32 u[IU]/mL (ref 0.35–5.50)

## 2022-11-14 MED ORDER — MELOXICAM 15 MG PO TABS: 15.0000 mg | ORAL_TABLET | Freq: Every day | ORAL | 5 refills | Status: DC

## 2022-11-14 NOTE — Progress Notes (Signed)
Patient ID: Kelly Duncan, female  DOB: 23-Jan-1976, 47 y.o.   MRN: [id number] Patient Care Team    Relationship Specialty Notifications Start End  Natalia Leatherwood, DO PCP - General Family Medicine  12/26/17     Chief Complaint  Patient presents with   Annual Exam    [facility]; pt is fasting    Subjective: Kelly Duncan is a 47 y.o.  Female  present for CPE All past medical history, surgical history, allergies, family history, immunizations, medications and social history were updated in the electronic medical record today. All recent labs, ED visits and hospitalizations within the last year were reviewed.   Health maintenance:  Colonoscopy: Discussed colonoscopy versus Cologuard, pt elected to complete cologuard . [facility] Cervical cancer screening: last pap: 12/26/2017. Neg/neg cotest. Completed: Kelly Duncan. 5 yr rpt DUE 2024-2025 Immunizations: tdap 12/2016 UTD, Influenza UTD 07/2018 (encouraged yearly) Infectious disease screening: HIV completed, Hep C completed today DEXA: routine screen Assistive device: none Oxygen GNF:AOZH Patient has a Dental home. Hospitalizations/ED visits: reviewed     11/14/2022    9:03 AM 10/20/2022   10:39 AM 04/16/2021    3:43 PM 06/03/2020    1:53 PM 01/29/2019    2:00 PM  Depression screen PHQ 2/9  Decreased Interest 0 0 0 0 0  Down, Depressed, Hopeless 0 0 0 0 0  PHQ - 2 Score 0 0 0 0 0       No data to display          Immunization History  Administered Date(s) Administered   Influenza Inj Mdck Quad Pf 03/01/2019   Influenza,inj,Quad PF,6+ Mos 07/15/2018, 02/18/2022   Influenza-Unspecified 04/18/2013, 07/02/2018, 03/01/2019, 03/28/2020, 04/02/2021   Moderna Sars-Covid-2 Vaccination 09/11/2019, 10/09/2019, 06/24/2020   Tdap 12/03/2016   Unspecified SARS-COV-2 Vaccination 02/18/2022   Past Medical History:  Diagnosis Date   Cervical radiculopathy at C7 2017   Chicken pox    Constipation    De  Quervain's tenosynovitis, right 02/12/2019   Dysmenorrhea    Endometriosis    Frequent headaches    Ganglion cyst 06/03/2020   Ganglion cyst of finger of right hand 11/22/2014   Hepatitis A    childhood   Left cervical lymphadenopathy 09/18/2015   benign appearing [place]   Plantar fasciitis    Positive ANA (antinuclear antibody) 03/02/2015   Negative anti--DNA screen, negative intrinsic factor antibody, normal sed rate, normal CRP, normal SCL-70 antibody, normal Jo 1 antibody, centromere antibody, normal histone antibody   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   Vitamin B deficiency 09/08/2015   Vitamin D deficiency 09/08/2015   No Known Allergies Past Surgical History:  Procedure Laterality Date   DILATION AND CURETTAGE OF UTERUS     ENDOMETRIAL ABLATION  05/2006   ESOPHAGOGASTRODUODENOSCOPY  01/18/2016   Out esophagitis.  Meprazole 20 mg daily recommended.   TUBAL LIGATION  2006   Family History  Problem Relation Age of Onset   Hypertension Mother    Hyperlipidemia Mother    Thyroid disease Mother    Arthritis Mother    Diabetes Mother    Hyperlipidemia Father    Prostate cancer Father    Hearing loss Father    Hypertension Father    Heart disease Father    Hyperlipidemia Sister    Diabetes Sister    Arthritis Maternal Grandmother    Diabetes Maternal Grandmother    Hyperlipidemia  Maternal Grandmother    Hypertension Maternal Grandmother    Stroke Maternal Grandmother    Diabetes Maternal Grandfather    Hyperlipidemia Sister    Breast cancer Cousin    Breast cancer Paternal Aunt    Social History   Social History Narrative   Married.  Some college.  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   Smoke alarm in the home.   Wears her seatbelt.   Feels safe in her relationships.    Allergies as of 11/14/2022   No Known Allergies      Medication List         Accurate as of Nov 14, 2022  9:19 AM. If you have any questions, ask your nurse or doctor.          STOP taking these medications    traMADol 50 MG tablet Commonly known as: ULTRAM Stopped by: Felix Pacini, DO       TAKE these medications    ibuprofen 200 MG tablet Commonly known as: ADVIL Take 200 mg by mouth as needed.        All past medical history, surgical history, allergies, family history, immunizations andmedications were updated in the EMR today and reviewed under the history and medication portions of their EMR.     No results found for this or any previous visit (from the past 2160 hour(s)).   ROS: 14 pt review of systems performed and negative (unless mentioned in an HPI)  Objective: BP 111/73   Pulse 62   Temp 98 F (36.7 C)   Ht 4' 11.45" (1.51 m)   Wt 149 lb 9.6 oz (67.9 kg)   SpO2 100%   BMI 29.76 kg/m  Physical Exam Vitals and nursing note reviewed.  Constitutional:      General: She is not in acute distress.    Appearance: Normal appearance. She is not ill-appearing or toxic-appearing.  HENT:     Head: Normocephalic and atraumatic.     Right Ear: Tympanic membrane, ear canal and external ear normal. There is no impacted cerumen.     Left Ear: Tympanic membrane, ear canal and external ear normal. There is no impacted cerumen.     Nose: No congestion or rhinorrhea.     Mouth/Throat:     Mouth: Mucous membranes are moist.     Pharynx: Oropharynx is clear. No oropharyngeal exudate or posterior oropharyngeal erythema.  Eyes:     General: No scleral icterus.       Right eye: No discharge.        Left eye: No discharge.     Extraocular Movements: Extraocular movements intact.     Conjunctiva/sclera: Conjunctivae normal.     Pupils: Pupils are equal, round, and reactive to light.  Cardiovascular:     Rate and Rhythm: Normal rate and regular rhythm.     Pulses: Normal pulses.     Heart sounds: Normal heart sounds. No murmur  heard.    No friction rub. No gallop.  Pulmonary:     Effort: Pulmonary effort is normal. No respiratory distress.     Breath sounds: Normal breath sounds. No stridor. No wheezing, rhonchi or rales.  Chest:     Chest wall: No tenderness.  Abdominal:     General: Abdomen is flat. Bowel sounds are normal. There is no distension.     Palpations: Abdomen is soft. There is no mass.     Tenderness: There is  no abdominal tenderness. There is no right CVA tenderness, left CVA tenderness, guarding or rebound.     Hernia: No hernia is present.  Musculoskeletal:        General: No swelling, tenderness or deformity. Normal range of motion.     Cervical back: Normal range of motion and neck supple. No rigidity or tenderness.     Right lower leg: No edema.     Left lower leg: No edema.  Lymphadenopathy:     Cervical: No cervical adenopathy.  Skin:    General: Skin is warm and dry.     Coloration: Skin is not jaundiced or pale.     Findings: No bruising, erythema, lesion or rash.  Neurological:     General: No focal deficit present.     Mental Status: She is alert and oriented to person, place, and time. Mental status is at baseline.     Cranial Nerves: No cranial nerve deficit.     Sensory: No sensory deficit.     Motor: No weakness.     Coordination: Coordination normal.     Gait: Gait normal.     Deep Tendon Reflexes: Reflexes normal.  Psychiatric:        Mood and Affect: Mood normal.        Behavior: Behavior normal.        Thought Content: Thought content normal.        Judgment: Judgment normal.     No results found.  Assessment/plan: Kelly Duncan is a 47 y.o. female present for CPE Hypertriglyceridemia Diet controlled - TSH - Lipid panel Breast cancer screening by mammogram - MM DIGITAL SCREENING BILATERAL; Future Colon cancer screening - Cologuard; Future Encounter for hepatitis C screening test for low risk patient - Hepatitis C Antibody Diabetes mellitus  screening - Hemoglobin A1c Carpal tunnel: Seen by another provider 2-3 weeks ago.  Using night guards.  Not seeing much of improvement.  Start mobic 15 mg qd with food.  Continue night guard use and stretches demonstrated today F/u 8 weeks if not seeing improvement.  Encounter for preventive health examination Patient was encouraged to exercise greater than 150 minutes a week. Patient was encouraged to choose a diet filled with fresh fruits and vegetables, and lean meats. AVS provided to patient today for education/recommendation on gender specific health and safety maintenance. Colonoscopy: Discussed colonoscopy versus Cologuard, pt elected to complete cologuard . [facility] Cervical cancer screening: last pap: 12/26/2017. Neg/neg cotest. Completed: Kelly Duncan. 3-5 yr rpt Immunizations: tdap 12/2016 UTD, Influenza UTD 07/2018 (encouraged yearly) Infectious disease screening: HIV completed, Hep C completed today DEXA: routine screen - CBC with Differential/Platelet - Comprehensive metabolic panel - Hemoglobin A1c  Return in about 1 year (around 11/15/2023) for cpe (20 min).   Electronically signed by: Felix Pacini, DO [facility] [place]

## 2022-11-14 NOTE — Patient Instructions (Addendum)
Return in about 1 year (around 11/15/2023) for cpe (20 min).        Great to see you today.  I have refilled the medication(s) we provide.   If labs were collected, we will inform you of lab results once received either by echart message or telephone call.   - echart message- for normal results that have been seen by the patient already.   - telephone call: abnormal results or if patient has not viewed results in their echart.  

## 2022-11-15 ENCOUNTER — Telehealth: Payer: Self-pay | Admitting: Family Medicine

## 2022-11-15 LAB — HEPATITIS C ANTIBODY: Hepatitis C Ab: NONREACTIVE

## 2022-11-15 MED ORDER — FENOFIBRATE 145 MG PO TABS: 145.0000 mg | ORAL_TABLET | Freq: Every day | ORAL | 3 refills | Status: DC

## 2022-11-15 NOTE — Telephone Encounter (Signed)
Pt advised of results/instructions. 

## 2022-11-15 NOTE — Telephone Encounter (Signed)
Please call patient Kelly Duncan, Kelly Duncan function are normal Blood cell counts and electrolytes are normal Diabetes screening/A1c is normal  Hep c screen is normal Cholesterol panel: triglycerides are significantly elevated above 350 (normal < 150). Her triglycerides have been elevated consistently for a few years. The rest of the panel is at goal.  I would recommend she start a medication called fenofibrate to lower the triglycerides. This is a fiber based medicine.   Routine exercise and diet low in saturated fats, high in fiber, fresh vegetables and lean meats. Avoid butters. Olive oil best when using oil.

## 2022-12-18 ENCOUNTER — Encounter: Payer: Self-pay | Admitting: Family Medicine

## 2023-02-02 ENCOUNTER — Ambulatory Visit: Payer: 59 | Admitting: Family Medicine

## 2023-02-13 ENCOUNTER — Ambulatory Visit: Payer: 59 | Admitting: Family Medicine

## 2023-03-13 DIAGNOSIS — Z1231 Encounter for screening mammogram for malignant neoplasm of breast: Secondary | ICD-10-CM

## 2023-05-15 ENCOUNTER — Ambulatory Visit
Admission: RE | Admit: 2023-05-15 | Discharge: 2023-05-15 | Disposition: A | Discharge status: Home / Self Care | Payer: 59 | Source: Ambulatory Visit | Attending: Family Medicine | Admitting: Family Medicine

## 2023-05-15 DIAGNOSIS — Z1231 Encounter for screening mammogram for malignant neoplasm of breast: Secondary | ICD-10-CM

## 2023-05-18 ENCOUNTER — Telehealth: Payer: 59 | Admitting: Physician Assistant

## 2023-05-18 DIAGNOSIS — J069 Acute upper respiratory infection, unspecified: Secondary | ICD-10-CM

## 2023-05-18 MED ORDER — FLUTICASONE PROPIONATE 50 MCG/ACT NA SUSP: 2.0000 | Freq: Every day | NASAL | 0 refills | Status: DC

## 2023-05-18 MED ORDER — BENZONATATE 100 MG PO CAPS: 100.0000 mg | ORAL_CAPSULE | Freq: Three times a day (TID) | ORAL | 0 refills | Status: DC | PRN

## 2023-05-18 NOTE — Progress Notes (Signed)
E-Visit for Upper Respiratory Infection   We are sorry you are not feeling well.  Here is how we plan to help!  Based on what you have shared with me, it looks like you may have a viral upper respiratory infection.  Upper respiratory infections are caused by a large number of viruses; however, rhinovirus is the most common cause.   Symptoms vary from person to person, with common symptoms including sore throat, cough, fatigue or lack of energy and feeling of general discomfort.  A low-grade fever of up to 100.4 may present, but is often uncommon.  Symptoms vary however, and are closely related to a person's age or underlying illnesses.  The most common symptoms associated with an upper respiratory infection are nasal discharge or congestion, cough, sneezing, headache and pressure in the ears and face.  These symptoms usually persist for about 3 to 10 days, but can last up to 2 weeks.  It is important to know that upper respiratory infections do not cause serious illness or complications in most cases.    Upper respiratory infections can be transmitted from person to person, with the most common method of transmission being a person's hands.  The virus is able to live on the skin and can infect other persons for up to 2 hours after direct contact.  Also, these can be transmitted when someone coughs or sneezes; thus, it is important to cover the mouth to reduce this risk.  To keep the spread of the illness at bay, good hand hygiene is very important.  This is an infection that is most likely caused by a virus. There are no specific treatments other than to help you with the symptoms until the infection runs its course.  We are sorry you are not feeling well.  Here is how we plan to help!   For nasal congestion, you may use an oral decongestants such as Mucinex D or if you have glaucoma or high blood pressure use plain Mucinex.  Saline nasal spray or nasal drops can help and can safely be used as often as  needed for congestion.  For your congestion, I have prescribed Fluticasone nasal spray one spray in each nostril twice a day  If you do not have a history of heart disease, hypertension, diabetes or thyroid disease, prostate/bladder issues or glaucoma, you may also use Sudafed to treat nasal congestion.  It is highly recommended that you consult with a pharmacist or your primary care physician to ensure this medication is safe for you to take.     If you have a cough, you may use cough suppressants such as Delsym and Robitussin.  If you have glaucoma or high blood pressure, you can also use Coricidin HBP.   For cough I have prescribed for you A prescription cough medication called Tessalon Perles 100 mg. You may take 1-2 capsules every 8 hours as needed for cough  If you have a sore or scratchy throat, use a saltwater gargle-  to  teaspoon of salt dissolved in a 4-ounce to 8-ounce glass of warm water.  Gargle the solution for approximately 15-30 seconds and then spit.  It is important not to swallow the solution.  You can also use throat lozenges/cough drops and Chloraseptic spray to help with throat pain or discomfort.  Warm or cold liquids can also be helpful in relieving throat pain.  For headache, pain or general discomfort, you can use Ibuprofen or Tylenol as directed.   Some authorities believe   that zinc sprays or the use of Echinacea may shorten the course of your symptoms.   HOME CARE Only take medications as instructed by your medical team. Be sure to drink plenty of fluids. Water is fine as well as fruit juices, sodas and electrolyte beverages. You may want to stay away from caffeine or alcohol. If you are nauseated, try taking small sips of liquids. How do you know if you are getting enough fluid? Your urine should be a pale yellow or almost colorless. Get rest. Taking a steamy shower or using a humidifier may help nasal congestion and ease sore throat pain. You can place a towel over  your head and breathe in the steam from hot water coming from a faucet. Using a saline nasal spray works much the same way. Cough drops, hard candies and sore throat lozenges may ease your cough. Avoid close contacts especially the very young and the elderly Cover your mouth if you cough or sneeze Always remember to wash your hands.   GET HELP RIGHT AWAY IF: You develop worsening fever. If your symptoms do not improve within 10 days You develop yellow or green discharge from your nose over 3 days. You have coughing fits You develop a severe head ache or visual changes. You develop shortness of breath, difficulty breathing or start having chest pain Your symptoms persist after you have completed your treatment plan  MAKE SURE YOU  Understand these instructions. Will watch your condition. Will get help right away if you are not doing well or get worse.  Thank you for choosing an e-visit.  Your e-visit answers were reviewed by a board certified advanced clinical practitioner to complete your personal care plan. Depending upon the condition, your plan could have included both over the counter or prescription medications.  Please review your pharmacy choice. Make sure the pharmacy is open so you can pick up prescription now. If there is a problem, you may contact your provider through MyChart messaging and have the prescription routed to another pharmacy.  Your safety is important to us. If you have drug allergies check your prescription carefully.   For the next 24 hours you can use MyChart to ask questions about today's visit, request a non-urgent call back, or ask for a work or school excuse. You will get an email in the next two days asking about your experience. I hope that your e-visit has been valuable and will speed your recovery.  I have spent 5 minutes in review of e-visit questionnaire, review and updating patient chart, medical decision making and response to patient.    Tejuan Gholson M Zella Dewan, PA-C  

## 2023-05-19 ENCOUNTER — Ambulatory Visit: Payer: 59 | Admitting: Family Medicine

## 2023-05-25 ENCOUNTER — Other Ambulatory Visit: Payer: Self-pay | Admitting: Family Medicine

## 2023-07-26 IMAGING — MG MM DIGITAL SCREENING BILAT W/ TOMO AND CAD
8 series · 9 of 24 positions shown · non-contrast
Comparison: Previous exam(s).

CLINICAL DATA: Screening.

EXAM:
DIGITAL SCREENING BILATERAL MAMMOGRAM WITH TOMOSYNTHESIS AND CAD
TECHNIQUE: Bilateral screening digital craniocaudal and mediolateral oblique
mammograms were obtained. Bilateral screening digital breast
tomosynthesis was performed. The images were evaluated with
computer-aided detection.

[R MLO synth-2D]
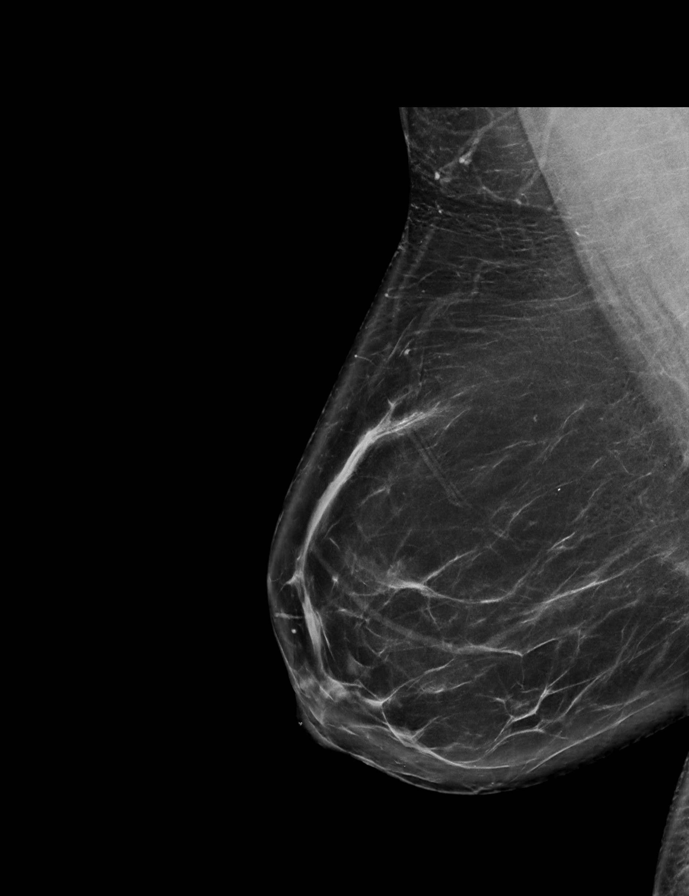

[R CC synth-2D]
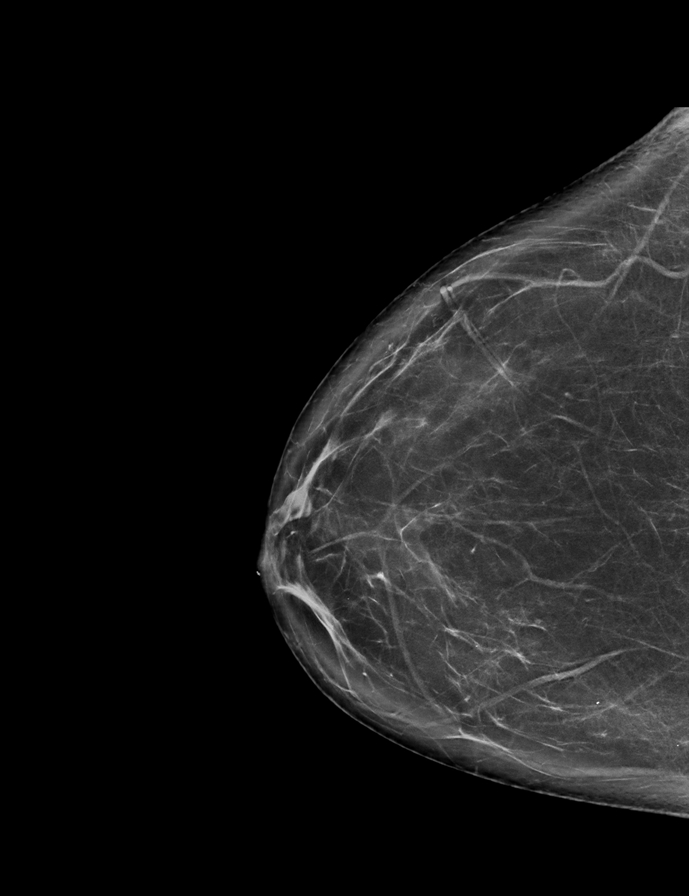

[L CC synth-2D]
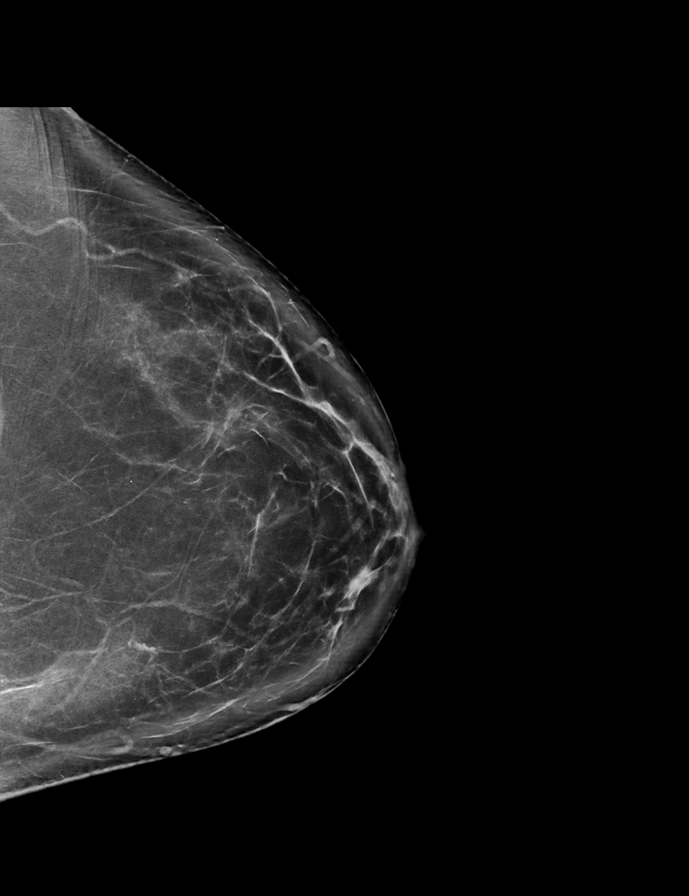

[L MLO synth-2D]
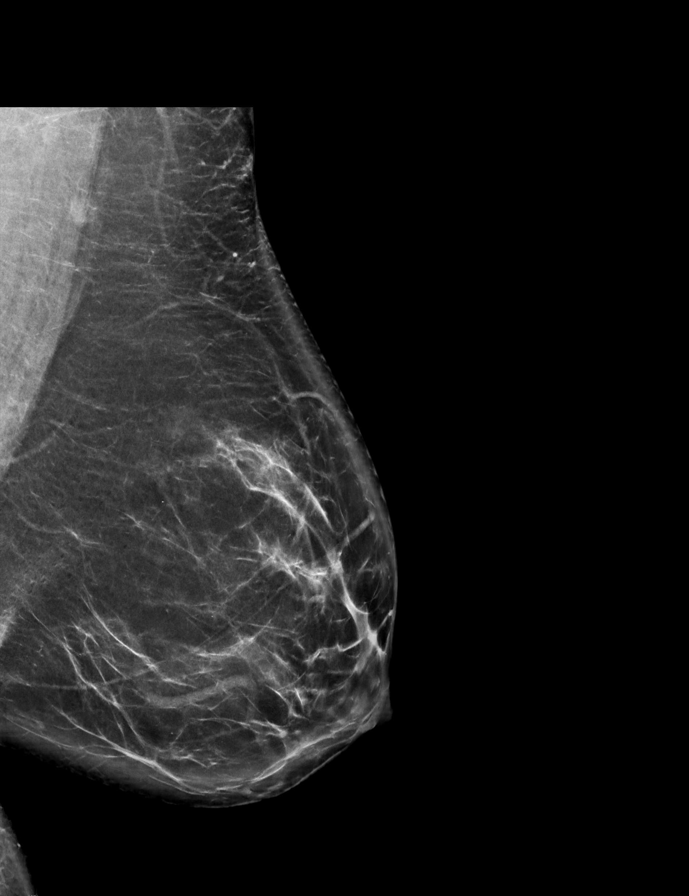

[R CC tomo · 2 of 83 frames shown]
[frame 27/83]
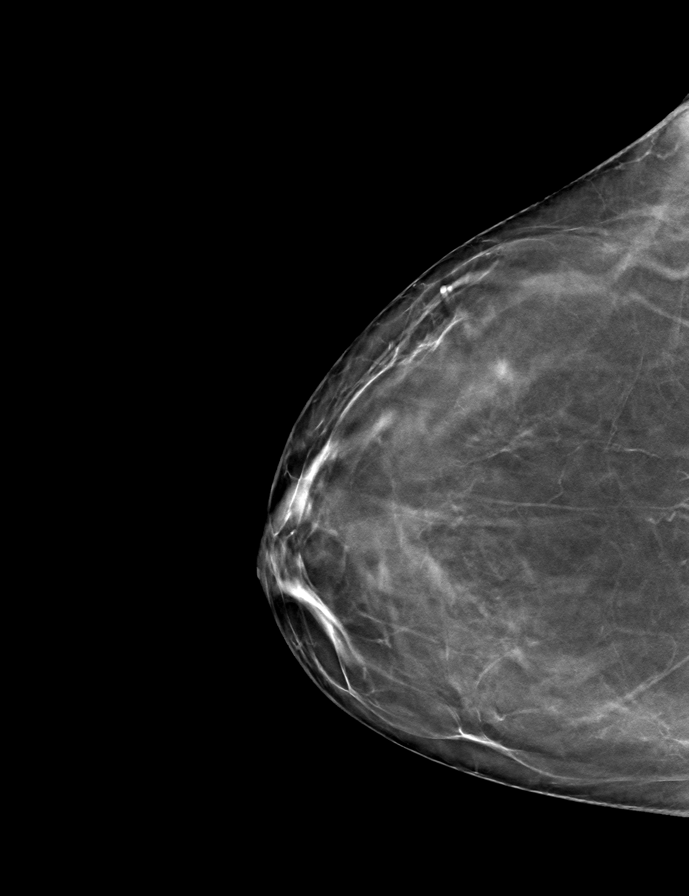
[frame 42/83]
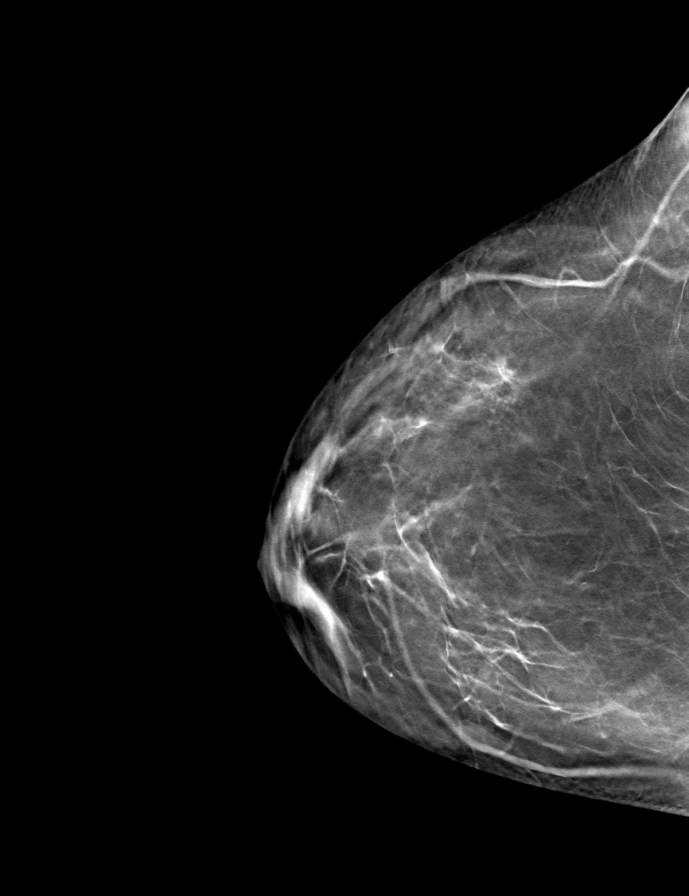

[L CC tomo · tomo slice 41/82.0]
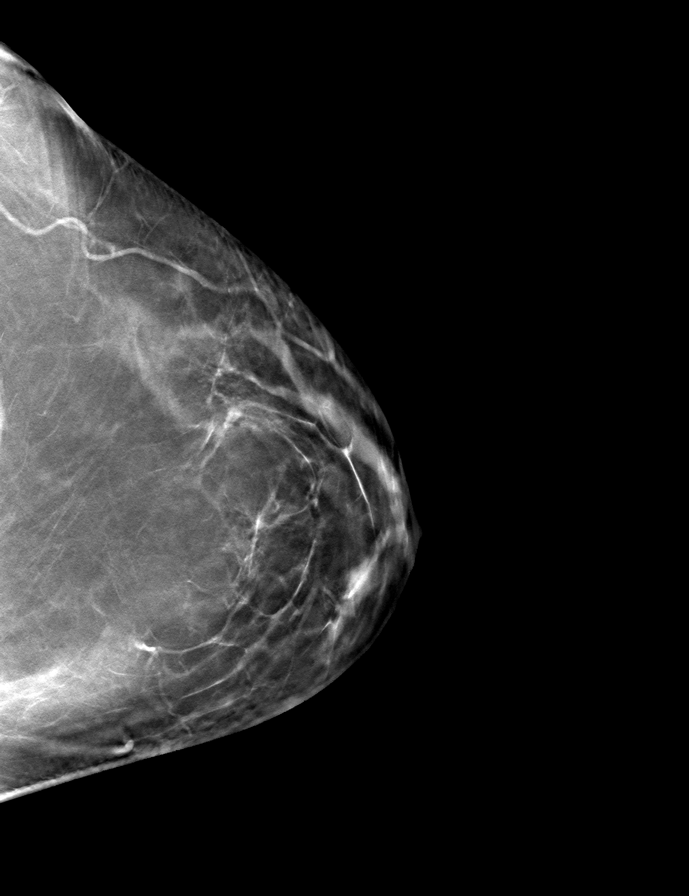

[L MLO tomo · tomo slice 44/87.0]
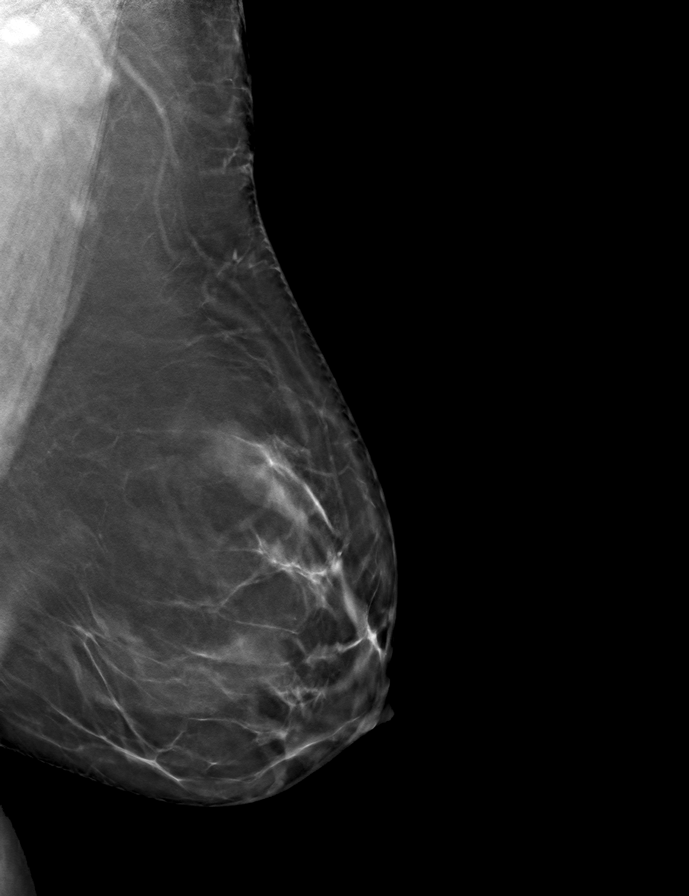

[R MLO tomo · tomo slice 43/85.0]
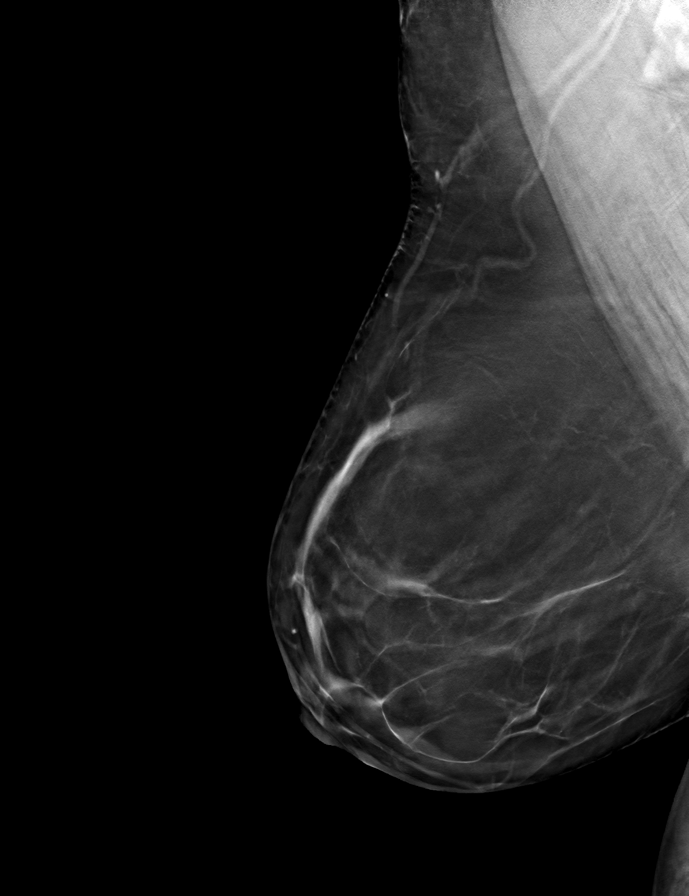

[9 of 24 positions shown; findings below may reference images not displayed]

ACR Breast Density Category b: There are scattered areas of
fibroglandular density.
FINDINGS: There are no findings suspicious for malignancy.
IMPRESSION: No mammographic evidence of malignancy. A result letter of this
screening mammogram will be mailed directly to the patient.

RECOMMENDATION:
Screening mammogram in one year. (Code:51-O-LD2)

BI-RADS CATEGORY  1: Negative.

## 2023-08-14 NOTE — Progress Notes (Unsigned)
New Patient Office Visit  Subjective    Patient ID: Kelly Duncan, female    DOB: Sep 22, 1975  Age: 48 y.o. MRN: [id number]  CC:  Chief Complaint  Patient presents with   Establish Care    Patient has had gradual weight gain for the past 6 years. Patient believes this is due part as a menopausal symptom. Patient notes no dietary changes to prompt weight gain over time and avoids most high sugar items. Would like to do more exercise but has had joint issues (patient does have arthritis). Body weight goal of 130lbs-140lbs    HPI Kelly Duncan presents to establish care Reports increased weight gain, does not exercise much. Reports increased back pain with exercise. Reports that she does not eat sweets. Would like [facility] to [facility]. She is not fasting today, will come back for fasting labs.  She is due for flu vaccine, would like to do this today. Has family history of thyroid disease, DM, denies personal hx of the same. Reports low BP at home 90s/50s with dizziness with position changes. Has never had this evaluated.  Denies ever fainting or losing consciousness with these episodes.  Denies other concerns today. Medical hx as outlined below.   Outpatient Encounter Medications as of 08/15/2023  Medication Sig   fenofibrate (TRICOR) 145 MG tablet Take 1 tablet (145 mg total) by mouth daily.   ibuprofen (ADVIL) 200 MG tablet Take 200 mg by mouth as needed.   meloxicam (MOBIC) 15 MG tablet TAKE 1 TABLET (15 MG TOTAL) BY MOUTH DAILY.   [DISCONTINUED] benzonatate (TESSALON) 100 MG capsule Take 1-2 capsules (100-200 mg total) by mouth 3 (three) times daily as needed. (Patient not taking: Reported on 08/15/2023)   [DISCONTINUED] fluticasone (FLONASE) 50 MCG/ACT nasal spray Place 2 sprays into both nostrils daily. (Patient not taking: Reported on 08/15/2023)   No facility-administered encounter medications on file as of 08/15/2023.    Past Medical History:   Diagnosis Date   Cervical radiculopathy at C7 2017   Chicken pox    Constipation    De Quervain's tenosynovitis, right 02/12/2019   Dysmenorrhea    Endometriosis    Frequent headaches    Ganglion cyst 06/03/2020   Ganglion cyst of finger of right hand 11/22/2014   Hepatitis A    childhood   Left cervical lymphadenopathy 09/18/2015   benign appearing [place]   Plantar fasciitis    Positive ANA (antinuclear antibody) 03/02/2015   Negative anti--DNA screen, negative intrinsic factor antibody, normal sed rate, normal CRP, normal SCL-70 antibody, normal Jo 1 antibody, centromere antibody, normal histone antibody   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   Vitamin B deficiency 09/08/2015   Vitamin D deficiency 09/08/2015    Past Surgical History:  Procedure Laterality Date   DILATION AND CURETTAGE OF UTERUS     ENDOMETRIAL ABLATION  05/2006   ESOPHAGOGASTRODUODENOSCOPY  01/18/2016   Out esophagitis.  Meprazole 20 mg daily recommended.   TUBAL LIGATION  2006    Family History  Problem Relation Age of Onset   Hypertension Mother    Hyperlipidemia Mother    Thyroid disease Mother    Arthritis Mother    Diabetes Mother    Stroke Mother    Hyperlipidemia Father    Prostate cancer Father    Hearing loss Father    Hypertension Father    Heart disease Father  Cancer Father    Hyperlipidemia Sister    Diabetes Sister    Hypertension Sister    Obesity Sister    Arthritis Maternal Grandmother    Diabetes Maternal Grandmother    Hyperlipidemia Maternal Grandmother    Hypertension Maternal Grandmother    Stroke Maternal Grandmother    Varicose Veins Maternal Grandmother    Diabetes Maternal Grandfather    Hyperlipidemia Sister    Depression Sister    Diabetes Sister    Hypertension Sister    Breast cancer Cousin    Breast cancer Paternal Aunt     Social History   Socioeconomic History    Marital status: Married    Spouse name: Not on file   Number of children: Not on file   Years of education: Not on file   Highest education level: Associate degree: occupational, Scientist, product/process development, or vocational program  Occupational History   Not on file  Tobacco Use   Smoking status: Never   Smokeless tobacco: Never  Vaping Use   Vaping status: Never Used  Substance and Sexual Activity   Alcohol use: Never   Drug use: Never   Sexual activity: Yes    Partners: Male    Birth control/protection: Surgical  Other Topics Concern   Not on file  Social History Narrative   Married.  Some college.  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   Smoke alarm in the home.   Wears her seatbelt.   Feels safe in her relationships.   Social Drivers of Corporate investment banker Strain: Low Risk  (08/14/2023)   Overall Financial Resource Strain (CARDIA)    Difficulty of Paying Living Expenses: Not hard at all  Food Insecurity: No Food Insecurity (08/14/2023)   Hunger Vital Sign    Worried About Running Out of Food in the Last Year: Never true    Ran Out of Food in the Last Year: Never true  Transportation Needs: No Transportation Needs (08/14/2023)   PRAPARE - Administrator, Civil Service (Medical): No    Lack of Transportation (Non-Medical): No  Physical Activity: Unknown (08/14/2023)   Exercise Vital Sign    Days of Exercise per Week: 0 days    Minutes of Exercise per Session: Not on file  Stress: Stress Concern Present (08/14/2023)   [employer] of Occupational Health - Occupational Stress Questionnaire    Feeling of Stress : To some extent  Social Connections: Socially Isolated (08/14/2023)   Social Connection and Isolation Panel [NHANES]    Frequency of Communication with Friends and Family: Twice a week    Frequency of Social Gatherings with Friends and Family: Never    Attends Religious Services: Never    Database administrator or  Organizations: No    Attends Engineer, structural: Not on file    Marital Status: Married  Intimate Partner Violence: Unknown (10/08/2021)   Received from [employer] [facility], [facility]   HITS    Physically Hurt: Not on file    Insult or Talk Down To: Not on file    Threaten Physical Harm: Not on file    Scream or Curse: Not on file    ROS Per HPI      Objective    BP 118/82   Pulse 69   Temp 98.5 F (36.9 C)   Ht 4' 11.45" (1.51 m)   Wt 157 lb 12.8 oz (71.6 kg)  SpO2 99%   BMI 31.39 kg/m   Physical Exam Vitals and nursing note reviewed.  Constitutional:      General: She is not in acute distress.    Appearance: Normal appearance. She is obese.  HENT:     Head: Normocephalic and atraumatic.     Right Ear: Tympanic membrane and ear canal normal.     Left Ear: Tympanic membrane and ear canal normal.     Nose: Nose normal. No congestion or rhinorrhea.  Eyes:     Extraocular Movements: Extraocular movements intact.     Pupils: Pupils are equal, round, and reactive to light.  Cardiovascular:     Rate and Rhythm: Normal rate and regular rhythm.     Pulses: Normal pulses.     Heart sounds: Normal heart sounds.  Pulmonary:     Effort: Pulmonary effort is normal. No respiratory distress.     Breath sounds: Normal breath sounds. No wheezing, rhonchi or rales.  Musculoskeletal:        General: Normal range of motion.     Cervical back: Normal range of motion.     Right lower leg: No edema.     Left lower leg: No edema.  Lymphadenopathy:     Cervical: No cervical adenopathy.  Skin:    General: Skin is warm and dry.     Capillary Refill: Capillary refill takes less than 2 seconds.  Neurological:     General: No focal deficit present.     Mental Status: She is alert and oriented to person, place, and time.  Psychiatric:        Mood and Affect: Mood normal.        Thought Content: Thought content normal.        Assessment & Plan:   Class 1 obesity due  to excess calories with serious comorbidity and body mass index (BMI) of 31.0 to 31.9 in adult -     Amb Ref to Medical Weight Management  Mixed hyperlipidemia -     Lipid panel; Future  Dizziness -     CBC with Differential/Platelet; Future -     Comprehensive metabolic panel; Future -     TSH; Future -     Insulin, random; Future  Orthostatic hypotension -     CBC with Differential/Platelet; Future -     Comprehensive metabolic panel; Future -     TSH; Future -     Insulin, random; Future  Weight gain -     CBC with Differential/Platelet; Future -     TSH; Future -     Insulin, random; Future -     Amb Ref to Medical Weight Management  Post-menopause -     CBC with Differential/Platelet; Future -     TSH; Future  Insomnia, unspecified type -     CBC with Differential/Platelet; Future -     Comprehensive metabolic panel; Future -     TSH; Future  Vitamin D deficiency -     VITAMIN D 25 Hydroxy (Vit-D Deficiency, Fractures); Future -     TSH; Future  Vitamin B12 deficiency  Colon cancer screening -     Cologuard  Immunization due -     Flu vaccine trivalent PF, 6mos and older(Flulaval,Afluria,Fluarix,Fluzone)  Medication management -     CBC with Differential/Platelet; Future -     Comprehensive metabolic panel; Future -     Lipid panel; Future -     VITAMIN D 25 Hydroxy (Vit-D Deficiency,  Fractures); Future -     TSH; Future -     Insulin, random; Future  -Discussed increasing fluids, salt to help beef up and stabilize her blood pressure. - Also discussed that walking more for exercise. -Discussed wearing compression socks to also help stabilize blood pressure and dizziness  Return in about 4 weeks (around 09/12/2023) for recheck dizziness.   Moshe Cipro, FNP

## 2023-08-15 ENCOUNTER — Encounter: Payer: Self-pay | Admitting: Family Medicine

## 2023-08-15 ENCOUNTER — Ambulatory Visit: Payer: 59 | Admitting: Family Medicine

## 2023-08-15 VITALS — BP 118/82 | HR 69 | Temp 98.5°F | Ht 59.45 in | Wt 157.8 lb

## 2023-08-15 DIAGNOSIS — E6609 Other obesity due to excess calories: Secondary | ICD-10-CM

## 2023-08-15 DIAGNOSIS — E782 Mixed hyperlipidemia: Secondary | ICD-10-CM

## 2023-08-15 DIAGNOSIS — Z79899 Other long term (current) drug therapy: Secondary | ICD-10-CM

## 2023-08-15 DIAGNOSIS — I951 Orthostatic hypotension: Secondary | ICD-10-CM

## 2023-08-15 DIAGNOSIS — R42 Dizziness and giddiness: Secondary | ICD-10-CM | POA: Diagnosis not present

## 2023-08-15 DIAGNOSIS — Z23 Encounter for immunization: Secondary | ICD-10-CM | POA: Diagnosis not present

## 2023-08-15 DIAGNOSIS — Z780 Asymptomatic menopausal state: Secondary | ICD-10-CM

## 2023-08-15 DIAGNOSIS — G4700 Insomnia, unspecified: Secondary | ICD-10-CM

## 2023-08-15 DIAGNOSIS — Z1211 Encounter for screening for malignant neoplasm of colon: Secondary | ICD-10-CM

## 2023-08-15 DIAGNOSIS — E66811 Obesity, class 1: Secondary | ICD-10-CM

## 2023-08-15 DIAGNOSIS — E559 Vitamin D deficiency, unspecified: Secondary | ICD-10-CM

## 2023-08-15 DIAGNOSIS — E538 Deficiency of other specified B group vitamins: Secondary | ICD-10-CM

## 2023-08-15 DIAGNOSIS — R635 Abnormal weight gain: Secondary | ICD-10-CM

## 2023-08-15 DIAGNOSIS — Z6831 Body mass index (BMI) 31.0-31.9, adult: Secondary | ICD-10-CM

## 2023-08-15 NOTE — Patient Instructions (Addendum)
Welcome to [facility]!  Differin gel for the face as needed.  Recommend wearing compression socks, drinking fluid with electrolytes, may add in salt as well.  We are checking labs today, will be in contact with any results that require further attention.  I have ordered a Cologuard for colon cancer screening for you. This will come to your house in the mail, please follow the directions and send specimen back through the mail.  Will be in contact with results once they are received.  Follow up with me in about 1 month to recheck dizziness

## 2023-08-21 ENCOUNTER — Other Ambulatory Visit (INDEPENDENT_AMBULATORY_CARE_PROVIDER_SITE_OTHER): Payer: 59

## 2023-08-21 DIAGNOSIS — Z780 Asymptomatic menopausal state: Secondary | ICD-10-CM | POA: Diagnosis not present

## 2023-08-21 DIAGNOSIS — R635 Abnormal weight gain: Secondary | ICD-10-CM

## 2023-08-21 DIAGNOSIS — I951 Orthostatic hypotension: Secondary | ICD-10-CM

## 2023-08-21 DIAGNOSIS — E559 Vitamin D deficiency, unspecified: Secondary | ICD-10-CM

## 2023-08-21 DIAGNOSIS — E782 Mixed hyperlipidemia: Secondary | ICD-10-CM | POA: Diagnosis not present

## 2023-08-21 DIAGNOSIS — G4700 Insomnia, unspecified: Secondary | ICD-10-CM

## 2023-08-21 DIAGNOSIS — Z79899 Other long term (current) drug therapy: Secondary | ICD-10-CM

## 2023-08-21 DIAGNOSIS — R42 Dizziness and giddiness: Secondary | ICD-10-CM | POA: Diagnosis not present

## 2023-08-21 LAB — CBC WITH DIFFERENTIAL/PLATELET
Basophils Absolute: 0 10*3/uL (ref 0.0–0.1)
Basophils Relative: 0.4 % (ref 0.0–3.0)
Eosinophils Absolute: 0.3 10*3/uL (ref 0.0–0.7)
Eosinophils Relative: 3.8 % (ref 0.0–5.0)
HCT: 41.4 % (ref 36.0–46.0)
Hemoglobin: 14 g/dL (ref 12.0–15.0)
Lymphocytes Relative: 44.6 % (ref 12.0–46.0)
Lymphs Abs: 3.2 10*3/uL (ref 0.7–4.0)
MCHC: 33.7 g/dL (ref 30.0–36.0)
MCV: 93.6 fL (ref 78.0–100.0)
Monocytes Absolute: 0.4 10*3/uL (ref 0.1–1.0)
Monocytes Relative: 5.7 % (ref 3.0–12.0)
Neutro Abs: 3.3 10*3/uL (ref 1.4–7.7)
Neutrophils Relative %: 45.5 % (ref 43.0–77.0)
Platelets: 260 10*3/uL (ref 150.0–400.0)
RBC: 4.43 Mil/uL (ref 3.87–5.11)
RDW: 13.1 % (ref 11.5–15.5)
WBC: 7.2 10*3/uL (ref 4.0–10.5)

## 2023-08-21 LAB — COMPREHENSIVE METABOLIC PANEL
ALT: 208 U/L — ABNORMAL HIGH (ref 0–35)
AST: 100 U/L — ABNORMAL HIGH (ref 0–37)
Albumin: 4.4 g/dL (ref 3.5–5.2)
Alkaline Phosphatase: 63 U/L (ref 39–117)
BUN: 19 mg/dL (ref 6–23)
CO2: 26 meq/L (ref 19–32)
Calcium: 9.3 mg/dL (ref 8.4–10.5)
Chloride: 103 meq/L (ref 96–112)
Creatinine, Ser: 0.82 mg/dL (ref 0.40–1.20)
GFR: 84.37 mL/min (ref >60.00)
Glucose, Bld: 83 mg/dL (ref 70–99)
Potassium: 3.9 meq/L (ref 3.5–5.1)
Sodium: 137 meq/L (ref 135–145)
Total Bilirubin: 0.4 mg/dL (ref 0.2–1.2)
Total Protein: 7.7 g/dL (ref 6.0–8.3)

## 2023-08-21 LAB — LIPID PANEL
Cholesterol: 216 mg/dL — ABNORMAL HIGH (ref 0–200)
HDL: 38.6 mg/dL — ABNORMAL LOW (ref >39.00)
LDL Cholesterol: 116 mg/dL — ABNORMAL HIGH (ref 0–99)
NonHDL: 177.52
Total CHOL/HDL Ratio: 6
Triglycerides: 307 mg/dL — ABNORMAL HIGH (ref 0.0–149.0)
VLDL: 61.4 mg/dL — ABNORMAL HIGH (ref 0.0–40.0)

## 2023-08-21 LAB — TSH: TSH: 1.57 u[IU]/mL (ref 0.35–5.50)

## 2023-08-22 ENCOUNTER — Encounter: Payer: Self-pay | Admitting: Family Medicine

## 2023-08-22 LAB — VITAMIN D 25 HYDROXY (VIT D DEFICIENCY, FRACTURES): VITD: 21.62 ng/mL — ABNORMAL LOW (ref 30.00–100.00)

## 2023-08-22 LAB — INSULIN, RANDOM: Insulin: 15 u[IU]/mL

## 2023-08-26 LAB — COLOGUARD: COLOGUARD: POSITIVE — AB

## 2023-09-12 ENCOUNTER — Ambulatory Visit: Payer: 59 | Admitting: Family Medicine

## 2023-09-12 ENCOUNTER — Encounter: Payer: Self-pay | Admitting: Family Medicine

## 2023-09-12 VITALS — BP 102/60 | HR 81 | Temp 98.4°F | Ht 59.5 in | Wt 158.2 lb

## 2023-09-12 DIAGNOSIS — E782 Mixed hyperlipidemia: Secondary | ICD-10-CM | POA: Diagnosis not present

## 2023-09-12 DIAGNOSIS — I951 Orthostatic hypotension: Secondary | ICD-10-CM | POA: Diagnosis not present

## 2023-09-12 DIAGNOSIS — M4802 Spinal stenosis, cervical region: Secondary | ICD-10-CM

## 2023-09-12 DIAGNOSIS — R195 Other fecal abnormalities: Secondary | ICD-10-CM

## 2023-09-12 DIAGNOSIS — R42 Dizziness and giddiness: Secondary | ICD-10-CM

## 2023-09-12 MED ORDER — MELOXICAM 15 MG PO TABS: 15.0000 mg | ORAL_TABLET | Freq: Every day | ORAL | 0 refills | Status: DC

## 2023-09-12 MED ORDER — MIDODRINE HCL 2.5 MG PO TABS: 2.5000 mg | ORAL_TABLET | Freq: Three times a day (TID) | ORAL | 1 refills | Status: DC

## 2023-09-12 NOTE — Progress Notes (Signed)
   Established Patient Office Visit  Subjective   Patient ID: Kelly Duncan, female    DOB: 25-Aug-1975  Age: 48 y.o. MRN: [id number]  Chief Complaint  Patient presents with   Follow-up    HPI Patient presents today for follow up of dizziness with lower blood pressures.  Reports that blood pressures are still pretty low high 90s over 50s.  Does still report dizziness. Has increased water and salt intake with minimal relief. Reports compliance with medication regimen. Requesting refill of meloxicam today. Not fasting today. Denies other concerns. Medical history as outlined below.  Had positive Cologuard, will refer to GI today.  ROS Per HPI    Objective:     BP 102/60 (BP Location: Left Arm, Patient Position: Sitting)   Pulse 81   Temp 98.4 F (36.9 C) (Temporal)   Ht 4' 11.5" (1.511 m)   Wt 158 lb 3.2 oz (71.8 kg)   SpO2 96%   BMI 31.42 kg/m   Physical Exam Vitals and nursing note reviewed.  Constitutional:      General: She is not in acute distress.    Appearance: Normal appearance. She is obese.  HENT:     Head: Normocephalic and atraumatic.     Nose: Nose normal.  Eyes:     Extraocular Movements: Extraocular movements intact.  Cardiovascular:     Rate and Rhythm: Normal rate and regular rhythm.     Heart sounds: Normal heart sounds.  Pulmonary:     Effort: Pulmonary effort is normal. No respiratory distress.     Breath sounds: Normal breath sounds. No wheezing, rhonchi or rales.  Musculoskeletal:     Cervical back: Normal range of motion.     Right lower leg: No edema.     Left lower leg: No edema.  Lymphadenopathy:     Cervical: No cervical adenopathy.  Neurological:     General: No focal deficit present.     Mental Status: She is alert and oriented to person, place, and time.  Psychiatric:        Mood and Affect: Mood normal.        Thought Content: Thought content normal.     No results found for any visits on 09/12/23.   The 10-year  ASCVD risk score (Arnett DK, et al., 2019) is: 1.2%    Assessment & Plan:   Dizziness -     Midodrine HCl; Take 1 tablet (2.5 mg total) by mouth 3 (three) times daily with meals.  Dispense: 90 tablet; Refill: 1  Orthostatic hypotension -     Midodrine HCl; Take 1 tablet (2.5 mg total) by mouth 3 (three) times daily with meals.  Dispense: 90 tablet; Refill: 1  Positive colorectal cancer screening using Cologuard test -     Ambulatory referral to Gastroenterology  Mixed hyperlipidemia  Cervical stenosis of spine -     Meloxicam; Take 1 tablet (15 mg total) by mouth daily.  Dispense: 30 tablet; Refill: 0    Will recheck labs at next visit Discussed decreasing fats, saturated and trans fats to help decrease cholesterol  Return in about 4 weeks (around 10/10/2023) for meds, BP.    Moshe Cipro, FNP

## 2023-09-12 NOTE — Patient Instructions (Signed)
 May take pepcid as needed for reflux.  Take midodrine 2.5mg  three times daily.  Follow up with me in about 1 month to recheck blood pressures and labs.

## 2023-09-29 ENCOUNTER — Encounter: Payer: Self-pay | Admitting: Gastroenterology

## 2023-10-05 ENCOUNTER — Other Ambulatory Visit: Payer: Self-pay | Admitting: Family Medicine

## 2023-10-05 DIAGNOSIS — R42 Dizziness and giddiness: Secondary | ICD-10-CM

## 2023-10-05 DIAGNOSIS — I951 Orthostatic hypotension: Secondary | ICD-10-CM

## 2023-10-08 ENCOUNTER — Other Ambulatory Visit: Payer: Self-pay | Admitting: Family Medicine

## 2023-10-08 DIAGNOSIS — M4802 Spinal stenosis, cervical region: Secondary | ICD-10-CM

## 2023-10-11 ENCOUNTER — Ambulatory Visit: Admitting: Family Medicine

## 2023-10-19 ENCOUNTER — Telehealth (AMBULATORY_SURGERY_CENTER): Admitting: *Deleted

## 2023-10-19 ENCOUNTER — Ambulatory Visit (AMBULATORY_SURGERY_CENTER): Admitting: *Deleted

## 2023-10-19 VITALS — Ht 59.5 in | Wt 158.0 lb

## 2023-10-19 DIAGNOSIS — R195 Other fecal abnormalities: Secondary | ICD-10-CM

## 2023-10-19 MED ORDER — NA SULFATE-K SULFATE-MG SULF 17.5-3.13-1.6 GM/177ML PO SOLN: 1.0000 | Freq: Once | ORAL | 0 refills | Status: AC

## 2023-10-19 NOTE — Progress Notes (Signed)
 Pre visit completed over telephone Patient with active MyChart, no issues logging on. Instructions forwarded through MyChart.     No egg or soy allergy known to patient  No issues known to pt with past sedation with any surgeries or procedures Patient denies ever being told they had issues or difficulty with intubation  No FH of Malignant Hyperthermia Pt is not on diet pills Pt is not on  home 02  Pt is not on blood thinners  Pt denies issues with constipation -- will take nightly Miralax the night before  No A fib or A flutter Have any cardiac testing pending-- no Pt instructed to use [email or web address] or GoodRx for a price reduction on prep

## 2023-10-19 NOTE — Telephone Encounter (Signed)
 Pre visit completed over telephone.

## 2023-10-30 ENCOUNTER — Encounter: Payer: Self-pay | Admitting: Certified Registered Nurse Anesthetist

## 2023-11-03 ENCOUNTER — Ambulatory Visit (AMBULATORY_SURGERY_CENTER): Admitting: Gastroenterology

## 2023-11-03 ENCOUNTER — Encounter: Payer: Self-pay | Admitting: Gastroenterology

## 2023-11-03 VITALS — BP 117/75 | HR 62 | Temp 98.7°F | Resp 13 | Ht 59.5 in | Wt 158.0 lb

## 2023-11-03 DIAGNOSIS — Z1211 Encounter for screening for malignant neoplasm of colon: Secondary | ICD-10-CM | POA: Diagnosis present

## 2023-11-03 DIAGNOSIS — K621 Rectal polyp: Secondary | ICD-10-CM

## 2023-11-03 DIAGNOSIS — D120 Benign neoplasm of cecum: Secondary | ICD-10-CM

## 2023-11-03 DIAGNOSIS — K648 Other hemorrhoids: Secondary | ICD-10-CM

## 2023-11-03 DIAGNOSIS — R195 Other fecal abnormalities: Secondary | ICD-10-CM

## 2023-11-03 DIAGNOSIS — K635 Polyp of colon: Secondary | ICD-10-CM

## 2023-11-03 DIAGNOSIS — K644 Residual hemorrhoidal skin tags: Secondary | ICD-10-CM | POA: Diagnosis not present

## 2023-11-03 DIAGNOSIS — D128 Benign neoplasm of rectum: Secondary | ICD-10-CM

## 2023-11-03 MED ORDER — SODIUM CHLORIDE 0.9 % IV SOLN
500.0000 mL | Freq: Once | INTRAVENOUS | Status: DC
Start: 2023-11-03 — End: 2023-11-03

## 2023-11-03 NOTE — Patient Instructions (Signed)
 Resume all of your previous medication today as ordered.  Read your discharge instructions.  YOU HAD AN ENDOSCOPIC PROCEDURE TODAY AT THE [facility]:   Refer to the procedure report that was given to you for any specific questions about what was found during the examination.  If the procedure report does not answer your questions, please call your gastroenterologist to clarify.  If you requested that your care partner not be given the details of your procedure findings, then the procedure report has been included in a sealed envelope for you to review at your convenience later.  YOU SHOULD EXPECT: Some feelings of bloating in the abdomen. Passage of more gas than usual.  Walking can help get rid of the air that was put into your GI tract during the procedure and reduce the bloating. If you had a lower endoscopy (such as a colonoscopy or flexible sigmoidoscopy) you may notice spotting of blood in your stool or on the toilet paper. If you underwent a bowel prep for your procedure, you may not have a normal bowel movement for a few days.  Please Note:  You might notice some irritation and congestion in your nose or some drainage.  This is from the oxygen used during your procedure.  There is no need for concern and it should clear up in a day or so.  SYMPTOMS TO REPORT IMMEDIATELY:  Following lower endoscopy (colonoscopy or flexible sigmoidoscopy):  Excessive amounts of blood in the stool  Significant tenderness or worsening of abdominal pains  Swelling of the abdomen that is new, acute  Fever of 100F or higher  For urgent or emergent issues, a gastroenterologist can be reached at any hour by calling [phone]. Do not use MyChart messaging for urgent concerns.    DIET:  We do recommend a small meal at first, but then you may proceed to your regular diet.  Drink plenty of fluids but you should avoid alcoholic beverages for 24 hours.  ACTIVITY:  You should plan to take it easy  for the rest of today and you should NOT DRIVE or use heavy machinery until tomorrow (because of the sedation medicines used during the test).    FOLLOW UP: Our staff will call the number listed on your records the next business day following your procedure.  We will call around 7:15- 8:00 am to check on you and address any questions or concerns that you may have regarding the information given to you following your procedure. If we do not reach you, we will leave a message.     If any biopsies were taken you will be contacted by phone or by letter within the next 1-3 weeks.  Please call us  at [phone] if you have not heard about the biopsies in 3 weeks.    SIGNATURES/CONFIDENTIALITY: You and/or your care partner have signed paperwork which will be entered into your electronic medical record.  These signatures attest to the fact that that the information above on your After Visit Summary has been reviewed and is understood.  Full responsibility of the confidentiality of this discharge information lies with you and/or your care-partner.

## 2023-11-03 NOTE — Progress Notes (Signed)
 Report given to PACU, vss

## 2023-11-03 NOTE — Progress Notes (Signed)
 Called to room to assist during endoscopic procedure.  Patient ID and intended procedure confirmed with present staff. Received instructions for my participation in the procedure from the performing physician.

## 2023-11-03 NOTE — Op Note (Addendum)
 [facility] Patient Name: Kelly Duncan Procedure Date: 11/03/2023 1:59 PM MRN: [id number] Endoscopist: Sergio Dandy , MD, [id number] Age: 48 Referring MD:  Date of Birth: March 24, 1976 Gender: Female Account #: [id number] Procedure:                Colonoscopy Indications:              Positive Cologuard test Medicines:                Monitored Anesthesia Care Procedure:                Pre-Anesthesia Assessment:                           - Prior to the procedure, a History and Physical                            was performed, and patient medications and                            allergies were reviewed. The patient's tolerance of                            previous anesthesia was also reviewed. The risks                            and benefits of the procedure and the sedation                            options and risks were discussed with the patient.                            All questions were answered, and informed consent                            was obtained. Prior Anticoagulants: The patient has                            taken no anticoagulant or antiplatelet agents. ASA                            Grade Assessment: II - A patient with mild systemic                            disease. After reviewing the risks and benefits,                            the patient was deemed in satisfactory condition to                            undergo the procedure.                           After obtaining informed consent, the colonoscope  was passed under direct vision. Throughout the                            procedure, the patient's blood pressure, pulse, and                            oxygen saturations were monitored continuously. The                            Olympus Scope SN: [id number] was introduced through                            the anus and advanced to the the cecum, identified                            by appendiceal orifice and  ileocecal valve. The                            colonoscopy was performed without difficulty. The                            patient tolerated the procedure well. The quality                            of the bowel preparation was good. The ileocecal                            valve, appendiceal orifice, and rectum were                            photographed. Scope In: 2:13:29 PM Scope Out: 2:25:05 PM Scope Withdrawal Time: 0 hours 7 minutes 23 seconds  Total Procedure Duration: 0 hours 11 minutes 36 seconds  Findings:                 The perianal and digital rectal examinations were                            normal.                           Two sessile polyps were found in the rectum and                            cecum. The polyps were 5 to 8 mm in size. These                            polyps were removed with a cold snare. Resection                            and retrieval were complete.                           Non-bleeding external and internal hemorrhoids were  found during retroflexion. The hemorrhoids were                            small. Complications:            No immediate complications. Estimated Blood Loss:     Estimated blood loss was minimal. Impression:               - Two 5 to 8 mm polyps in the rectum and in the                            cecum, removed with a cold snare. Resected and                            retrieved.                           - Non-bleeding external and internal hemorrhoids. Recommendation:           - Patient has a contact number available for                            emergencies. The signs and symptoms of potential                            delayed complications were discussed with the                            patient. Return to normal activities tomorrow.                            Written discharge instructions were provided to the                            patient.                           - Resume  previous diet.                           - Continue present medications.                           - Await pathology results.                           - Repeat colonoscopy in 5-10 years for surveillance                            based on pathology results. Lashaunda Schild V. Crescent Gotham, MD 11/03/2023 2:41:57 PM This report has been signed electronically.

## 2023-11-03 NOTE — Progress Notes (Signed)
 [facility] History and Physical   Primary Care Physician:  Wellington Half, FNP   Reason for Procedure:   positive cologuard  Plan:    colonoscopy with possible interventions as needed     HPI: Kelly Duncan is a very pleasant 48 y.o. female here for colonoscopy for positive cologuard. Denies any nausea, vomiting, abdominal pain, melena or bright red blood per rectum  The risks and benefits as well as alternatives of endoscopic procedure(s) have been discussed and reviewed. All questions answered. The patient agrees to proceed.    Past Medical History:  Diagnosis Date   Cervical radiculopathy at C7 2017   Chicken pox    Constipation    De Quervain's tenosynovitis, right 02/12/2019   Dysmenorrhea    Endometriosis    Frequent headaches    Ganglion cyst 06/03/2020   Ganglion cyst of finger of right hand 11/22/2014   Hepatitis A    childhood   Hyperlipidemia    Left cervical lymphadenopathy 09/18/2015   benign appearing US    Low blood pressure    Plantar fasciitis    Positive ANA (antinuclear antibody) 03/02/2015   Negative anti--DNA screen, negative intrinsic factor antibody, normal sed rate, normal CRP, normal SCL-70 antibody, normal Jo 1 antibody, centromere antibody, normal histone antibody   Spondylosis of cervical spine 12/19/2016   CT: Multilevel spondylosis with mild to moderate canal stenosis at C5-C6.   Vitamin B deficiency 09/08/2015   Vitamin D  deficiency 09/08/2015    Past Surgical History:  Procedure Laterality Date   DILATION AND CURETTAGE OF UTERUS     ENDOMETRIAL ABLATION  05/2006   ESOPHAGOGASTRODUODENOSCOPY  01/18/2016   Out esophagitis.  Meprazole 20 mg daily recommended.   TUBAL LIGATION  2006    Prior to Admission medications   Medication Sig Start Date End Date Taking? Authorizing Provider  fenofibrate  (TRICOR ) 145 MG tablet Take 1 tablet (145 mg total) by mouth daily. 11/15/22  Yes Kuneff, Renee A, DO  meloxicam   (MOBIC ) 15 MG tablet TAKE 1 TABLET (15 MG TOTAL) BY MOUTH DAILY. 10/09/23  Yes Wellington Half, FNP  midodrine  (PROAMATINE ) 2.5 MG tablet TAKE 1 TABLET (2.5 MG TOTAL) BY MOUTH 3 (THREE) TIMES DAILY WITH MEALS. 10/05/23  Yes Wellington Half, FNP  ibuprofen (ADVIL) 200 MG tablet Take 200 mg by mouth as needed.    [provider]    Current Outpatient Medications  Medication Sig Dispense Refill   fenofibrate  (TRICOR ) 145 MG tablet Take 1 tablet (145 mg total) by mouth daily. 90 tablet 3   meloxicam  (MOBIC ) 15 MG tablet TAKE 1 TABLET (15 MG TOTAL) BY MOUTH DAILY. 30 tablet 0   midodrine  (PROAMATINE ) 2.5 MG tablet TAKE 1 TABLET (2.5 MG TOTAL) BY MOUTH 3 (THREE) TIMES DAILY WITH MEALS. 270 tablet 1   ibuprofen (ADVIL) 200 MG tablet Take 200 mg by mouth as needed.     No current facility-administered medications for this visit.    Allergies as of 11/03/2023   (No Known Allergies)    Family History  Problem Relation Age of Onset   Hypertension Mother    Hyperlipidemia Mother    Thyroid disease Mother    Arthritis Mother    Diabetes Mother    Stroke Mother    Hyperlipidemia Father    Prostate cancer Father    Hearing loss Father    Hypertension Father    Heart disease Father    Cancer Father    Hyperlipidemia Sister  Diabetes Sister    Hypertension Sister    Obesity Sister    Hyperlipidemia Sister    Depression Sister    Diabetes Sister    Hypertension Sister    Breast cancer Paternal Aunt    Arthritis Maternal Grandmother    Diabetes Maternal Grandmother    Hyperlipidemia Maternal Grandmother    Hypertension Maternal Grandmother    Stroke Maternal Grandmother    Varicose Veins Maternal Grandmother    Diabetes Maternal Grandfather    Breast cancer Cousin    Colon cancer Other    Colon polyps Other     Social History   Socioeconomic History   Marital status: Married    Spouse name: Not on file   Number of children: Not on file   Years of education:  Not on file   Highest education level: Associate degree: occupational, Scientist, product/process development, or vocational program  Occupational History   Not on file  Tobacco Use   Smoking status: Never   Smokeless tobacco: Never  Vaping Use   Vaping status: Never Used  Substance and Sexual Activity   Alcohol use: Never   Drug use: Never   Sexual activity: Yes    Partners: Male    Birth control/protection: Surgical  Other Topics Concern   Not on file  Social History Narrative   Married.  Some college.  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   Moved from [place]  April 2019.   Smoke alarm in the home.   Wears her seatbelt.   Feels safe in her relationships.   Social Drivers of Corporate investment banker Strain: Low Risk  (08/14/2023)   Overall Financial Resource Strain (CARDIA)    Difficulty of Paying Living Expenses: Not hard at all  Food Insecurity: No Food Insecurity (08/14/2023)   Hunger Vital Sign    Worried About Running Out of Food in the Last Year: Never true    Ran Out of Food in the Last Year: Never true  Transportation Needs: No Transportation Needs (08/14/2023)   PRAPARE - Administrator, Civil Service (Medical): No    Lack of Transportation (Non-Medical): No  Physical Activity: Unknown (08/14/2023)   Exercise Vital Sign    Days of Exercise per Week: 0 days    Minutes of Exercise per Session: Not on file  Stress: Stress Concern Present (08/14/2023)   [employer] of Occupational Health - Occupational Stress Questionnaire    Feeling of Stress : To some extent  Social Connections: Socially Isolated (08/14/2023)   Social Connection and Isolation Panel [NHANES]    Frequency of Communication with Friends and Family: Twice a week    Frequency of Social Gatherings with Friends and Family: Never    Attends Religious Services: Never    Database administrator or Organizations: No    Attends Engineer, structural: Not on file    Marital Status: Married  Intimate  Partner Violence: Unknown (10/08/2021)   Received from [employer], [facility]   HITS    Physically Hurt: Not on file    Insult or Talk Down To: Not on file    Threaten Physical Harm: Not on file    Scream or Curse: Not on file    Review of Systems:  All other review of systems negative except as mentioned in the HPI.  Physical Exam: Vital signs in last 24 hours: BP 117/75   Pulse 62   Temp 98.7 F (37.1 C) (Skin)  Resp 13   Ht 4' 11.5" (1.511 m)   Wt 158 lb (71.7 kg)   SpO2 99%   BMI 31.38 kg/m  General:   Alert, NAD Lungs:  Clear .   Heart:  Regular rate and rhythm Abdomen:  Soft, nontender and nondistended. Neuro/Psych:  Alert and cooperative. Normal mood and affect. A and O x 3  Reviewed labs, radiology imaging, old records and pertinent past GI work up  Patient is appropriate for planned procedure(s) and anesthesia in an ambulatory setting   K. Veena Kalib Bhagat , MD [phone]

## 2023-11-03 NOTE — Progress Notes (Signed)
 Pt's states no medical or surgical changes since previsit or office visit.

## 2023-11-06 ENCOUNTER — Telehealth: Payer: Self-pay

## 2023-11-06 NOTE — Telephone Encounter (Signed)
 Follow up call to pt, lm for pt to call if having any difficulty with normal activities or eating and drinking.  Also to call if any other questions or concerns.

## 2023-11-08 ENCOUNTER — Ambulatory Visit: Admitting: Family Medicine

## 2023-11-08 LAB — SURGICAL PATHOLOGY

## 2023-11-09 ENCOUNTER — Other Ambulatory Visit: Payer: Self-pay | Admitting: Family Medicine

## 2023-11-09 DIAGNOSIS — M4802 Spinal stenosis, cervical region: Secondary | ICD-10-CM

## 2023-11-15 ENCOUNTER — Other Ambulatory Visit: Payer: Self-pay | Admitting: Family Medicine

## 2023-11-16 ENCOUNTER — Encounter: Payer: 59 | Admitting: Family Medicine

## 2023-11-16 NOTE — Progress Notes (Signed)
   Established Patient Office Visit  Subjective   Patient ID: Kelly Duncan, female    DOB: 02/20/1976  Age: 48 y.o. MRN: [id number]  No chief complaint on file.   HPI Patient presents today for medication management and hypotension.  Started midodrine  2.5mg  TID x 4 weeks ago. Reports compliance with medication regimen. Reports that she is feeling much better with the midodrine , is much less dizzy and has more energy than before. Requesting refill of fenofibrate  today. Not fasting today. Denies other concerns. Medical history as outlined below.  Requesting referral to gynecology to get established  ROS Per HPI    Objective:     BP 106/80 (BP Location: Left Arm, Patient Position: Sitting)   Pulse 76   Temp 98.4 F (36.9 C) (Temporal)   Ht 4' 11.5" (1.511 m)   Wt 160 lb 6.4 oz (72.8 kg)   SpO2 97%   BMI 31.85 kg/m   Physical Exam Vitals and nursing note reviewed.  Constitutional:      General: She is not in acute distress.    Appearance: Normal appearance. She is obese.  HENT:     Head: Normocephalic and atraumatic.     Nose: Nose normal.  Eyes:     Extraocular Movements: Extraocular movements intact.  Cardiovascular:     Rate and Rhythm: Normal rate and regular rhythm.     Heart sounds: Normal heart sounds.  Pulmonary:     Effort: Pulmonary effort is normal. No respiratory distress.     Breath sounds: Normal breath sounds. No wheezing, rhonchi or rales.  Musculoskeletal:     Cervical back: Normal range of motion.     Right lower leg: No edema.     Left lower leg: No edema.  Lymphadenopathy:     Cervical: No cervical adenopathy.  Neurological:     General: No focal deficit present.     Mental Status: She is alert and oriented to person, place, and time.  Psychiatric:        Mood and Affect: Mood normal.        Thought Content: Thought content normal.     No results found for any visits on 11/17/23.   The 10-year ASCVD risk score (Arnett DK, et  al., 2019) is: 1.4%    Assessment & Plan:   Orthostatic hypotension Assessment & Plan: Stable, improved Continue midodrine  2.5 mg 3 times daily   Mixed hyperlipidemia Assessment & Plan: Stable, continue fenofibrate   Orders: -     Fenofibrate ; Take 1 tablet (145 mg total) by mouth daily.  Dispense: 90 tablet; Refill: 3  Cervical cancer screening Assessment & Plan: Referral to gynecology  Orders: -     Ambulatory referral to Obstetrics / Gynecology     Return in about 3 months (around 02/17/2024) for meds, labs.    Wellington Half, FNP

## 2023-11-17 ENCOUNTER — Ambulatory Visit: Admitting: Family Medicine

## 2023-11-17 ENCOUNTER — Encounter: Payer: Self-pay | Admitting: Family Medicine

## 2023-11-17 VITALS — BP 106/80 | HR 76 | Temp 98.4°F | Ht 59.5 in | Wt 160.4 lb

## 2023-11-17 DIAGNOSIS — E782 Mixed hyperlipidemia: Secondary | ICD-10-CM | POA: Diagnosis not present

## 2023-11-17 DIAGNOSIS — I951 Orthostatic hypotension: Secondary | ICD-10-CM | POA: Diagnosis not present

## 2023-11-17 DIAGNOSIS — Z124 Encounter for screening for malignant neoplasm of cervix: Secondary | ICD-10-CM

## 2023-11-17 MED ORDER — FENOFIBRATE 145 MG PO TABS: 145.0000 mg | ORAL_TABLET | Freq: Every day | ORAL | 3 refills | Status: AC

## 2023-11-18 ENCOUNTER — Encounter: Payer: Self-pay | Admitting: Family Medicine

## 2023-11-18 DIAGNOSIS — E782 Mixed hyperlipidemia: Secondary | ICD-10-CM | POA: Insufficient documentation

## 2023-11-18 DIAGNOSIS — Z124 Encounter for screening for malignant neoplasm of cervix: Secondary | ICD-10-CM | POA: Insufficient documentation

## 2023-11-18 DIAGNOSIS — I951 Orthostatic hypotension: Secondary | ICD-10-CM | POA: Insufficient documentation

## 2023-11-18 NOTE — Assessment & Plan Note (Signed)
 Referral to gynecology

## 2023-11-18 NOTE — Assessment & Plan Note (Signed)
 Stable, continue fenofibrate

## 2023-11-18 NOTE — Assessment & Plan Note (Signed)
 Stable, improved Continue midodrine  2.5 mg 3 times daily

## 2023-12-10 ENCOUNTER — Other Ambulatory Visit: Payer: Self-pay | Admitting: Family Medicine

## 2023-12-10 DIAGNOSIS — M4802 Spinal stenosis, cervical region: Secondary | ICD-10-CM

## 2023-12-11 ENCOUNTER — Ambulatory Visit: Payer: Self-pay | Admitting: Gastroenterology

## 2023-12-13 ENCOUNTER — Encounter: Payer: Self-pay | Admitting: Obstetrics and Gynecology

## 2023-12-13 ENCOUNTER — Ambulatory Visit (INDEPENDENT_AMBULATORY_CARE_PROVIDER_SITE_OTHER): Admitting: Obstetrics and Gynecology

## 2023-12-13 ENCOUNTER — Other Ambulatory Visit (HOSPITAL_COMMUNITY)
Admission: RE | Admit: 2023-12-13 | Discharge: 2023-12-13 | Disposition: A | Discharge status: Home / Self Care | Source: Ambulatory Visit | Attending: Obstetrics and Gynecology | Admitting: Obstetrics and Gynecology

## 2023-12-13 VITALS — BP 121/85 | HR 71 | Ht 60.0 in | Wt 155.0 lb

## 2023-12-13 DIAGNOSIS — N951 Menopausal and female climacteric states: Secondary | ICD-10-CM | POA: Diagnosis not present

## 2023-12-13 DIAGNOSIS — N393 Stress incontinence (female) (male): Secondary | ICD-10-CM

## 2023-12-13 DIAGNOSIS — Z01419 Encounter for gynecological examination (general) (routine) without abnormal findings: Secondary | ICD-10-CM

## 2023-12-13 MED ORDER — CLIMARA PRO 0.045-0.015 MG/DAY TD PTWK: 1.0000 | MEDICATED_PATCH | TRANSDERMAL | 3 refills | Status: DC

## 2023-12-13 MED ORDER — PROGESTERONE MICRONIZED 100 MG PO CAPS: 100.0000 mg | ORAL_CAPSULE | Freq: Every day | ORAL | 3 refills | Status: AC

## 2023-12-13 MED ORDER — ESTRADIOL 0.0375 MG/24HR TD PTWK: 0.0375 mg | MEDICATED_PATCH | TRANSDERMAL | 12 refills | Status: DC

## 2023-12-13 NOTE — Progress Notes (Signed)
 ANNUAL EXAM Patient name: Kelly Duncan MRN [id number]  Date of birth: 1975/08/30 Chief Complaint:   new gyn (Last mammogram-11/24.)  History of Present Illness:   Kelly Duncan is a 48 y.o. G3P2 female being seen today for a routine annual exam.   Current concerns:  SUI - leaking with laugh/cough/sneeze for several years. Has not yet tried anything. Has to wear a pad. Would like to try something.  Hot flashes since going through menopause. Mostly during the day, less affected by night sweats.  Weight gain and unable to lose weight - Has cut carbs. Started walking. Tried to do HWW but was told didn't meet criteria. Will contact them for criteria.    No LMP recorded. Patient is perimenopausal.   Last MXR: 05/2023 Last Pap/Pap History:  2019 - pap/hpv wbnl  Review of Systems:   Pertinent items are noted in HPI Denies any headaches, blurred vision, fatigue, shortness of breath, chest pain, abdominal pain, abnormal vaginal discharge/itching/odor/irritation,  bowel movements, or intercourse unless otherwise stated above.  Pertinent History Reviewed:  Reviewed past medical,surgical, social and family history.  Reviewed problem list, medications and allergies. Physical Assessment:   Vitals:   12/13/23 0914  BP: 121/85  Pulse: 71  Weight: 155 lb (70.3 kg)  Height: 5' (1.524 m)  Body mass index is 30.27 kg/m.   Physical Examination:  General appearance - well appearing, and in no distress Mental status - alert, oriented to person, place, and time Psych:  She has a normal mood and affect Skin - warm and dry, normal color, no suspicious lesions noted Chest - effort normal Heart - normal rate  Breasts - breasts appear normal, no suspicious masses, no skin or nipple changes or axillary nodes Abdomen - soft, nontender, nondistended, no masses or organomegaly Pelvic -  Performed and: VULVA: normal appearing vulva with no masses, tenderness or lesions  VAGINA: normal  appearing vagina with normal color and discharge, no lesions  CERVIX: normal appearing cervix without discharge or lesions, no CMT UTERUS: Not examined ADNEXA: Not examined Extremities:  No swelling or varicosities noted  Chaperone present for exam  No results found for this or any previous visit (from the past 24 hours).  Assessment & Plan:  Shellene was seen today for new gyn.  Diagnoses and all orders for this visit:  Encounter for annual routine gynecological examination - Cervical cancer screening: Discussed guidelines. Pap with HPV done - Breast Health: Encouraged self breast awareness/SBE. Discussed limits of clinical breast exam for detecting breast cancer. Discussed importance of annual MXR. MXR is up to date: 05/2024 - Climacteric/Sexual health: Reviewed typical and atypical symptoms of menopause/peri-menopause. Discussed PMB and to call if any amount of spotting.  - Colonoscopy: up to date - F/U 12 months and prn -     Cytology - PAP( [facility])  SUI (stress urinary incontinence, female) She would like to do PFPT while she awaits appt with urogyn and cancel if not needed.  -     Ambulatory referral to Urogynecology -     Ambulatory referral to Physical Therapy  Hot flashes due to menopause - We discussed the treatment options for menopause as well as indications, both HRT and non-HRT - Discussed the benefits of each and relative effectiveness.  - Discussed goals of therapy i.e. reduction of hot flashes (not complete resolution). Reviewed full response takes up to 2-3 months, including for hormone therapy. - Discussed if HRT we do shortest amount of time at lowest  dose. We discussed annual attempts at coming of the HRT typically in the fall months when the weather has cooled down. - We discussed the differences in modes of therapy for HRT -- patch vs oral therapy.  - Discussed risks of HRT: E+P = breast cancer, clotting, MI/Stroke. Discussed risk of E alone. We reviewed that  limits of data from the [facility] regarding breast cancer impact - only progesterone used in that study was provera which is biologically active in the breast. We MAY reduce that risk by doing a different progesterone based therapy I.e. norethindrone. We reviewed the indication and necessity for progesterone and that estrogen alone in those with a uterus to prevent risk of endometrial cancer (due to unopposed estrogen) - Discussed different possible regimens - Discussed genitourinary symptoms i.e. urinary issues, vaginal dryness and dyspareunia and that for these symptoms, local treatment is best. - She would like: HRT -     estradiol -levonorgestrel (CLIMARA  PRO) 0.045-0.015 MG/DAY; Place 1 patch onto the skin once a week.  Orders Placed This Encounter  Procedures   Ambulatory referral to Urogynecology   Ambulatory referral to Physical Therapy    Meds:  Meds ordered this encounter  Medications   estradiol -levonorgestrel (CLIMARA  PRO) 0.045-0.015 MG/DAY    Sig: Place 1 patch onto the skin once a week.    Dispense:  12 patch    Refill:  3    Follow-up: Return in about 1 year (around 12/12/2024).  Lacey Pian, MD 12/13/2023 9:54 AM

## 2023-12-17 ENCOUNTER — Ambulatory Visit: Payer: Self-pay | Admitting: Obstetrics and Gynecology

## 2023-12-26 ENCOUNTER — Encounter: Payer: Self-pay | Admitting: Obstetrics and Gynecology

## 2023-12-29 ENCOUNTER — Ambulatory Visit: Admitting: Family Medicine

## 2024-01-07 ENCOUNTER — Other Ambulatory Visit: Payer: Self-pay | Admitting: Family Medicine

## 2024-01-07 DIAGNOSIS — M4802 Spinal stenosis, cervical region: Secondary | ICD-10-CM

## 2024-01-08 MED ORDER — ESTRADIOL 0.5 MG PO TABS: 0.5000 mg | ORAL_TABLET | Freq: Every day | ORAL | 3 refills | Status: AC

## 2024-01-08 NOTE — Addendum Note (Signed)
 Addended by: CLEATUS MOCCASIN A on: 01/08/2024 09:27 AM   Modules accepted: Orders

## 2024-02-02 ENCOUNTER — Other Ambulatory Visit: Payer: Self-pay | Admitting: Family Medicine

## 2024-02-02 DIAGNOSIS — M4802 Spinal stenosis, cervical region: Secondary | ICD-10-CM

## 2024-02-09 NOTE — Therapy (Addendum)
 OUTPATIENT PHYSICAL THERAPY FEMALE PELVIC EVALUATION   Patient Name: Kelly Duncan MRN: [id number] DOB:[birth year], 48 y.o., female Today's Date: 02/13/2024  END OF SESSION:  PT End of Session - 02/12/24 1616     Visit Number 1    Date for PT Re-Evaluation 07/14/24    Authorization Type [employer]    PT Start Time 1615    PT Stop Time 1655    PT Time Calculation (min) 40 min    Activity Tolerance Patient tolerated treatment well    Behavior During Therapy [facility] for tasks assessed/performed          Past Medical History:  Diagnosis Date   Cervical radiculopathy at C7 2017   Chicken pox    Constipation    De Quervain's tenosynovitis, right 02/12/2019   Dysmenorrhea    Endometriosis    Frequent headaches    Ganglion cyst 06/03/2020   Ganglion cyst of finger of right hand 11/22/2014   Hepatitis A    childhood   Hyperlipidemia    Left cervical lymphadenopathy 09/18/2015   benign appearing US    Low blood pressure    Plantar fasciitis    Positive ANA (antinuclear antibody) 03/02/2015   Negative anti--DNA screen, negative intrinsic factor antibody, normal sed rate, normal CRP, normal SCL-70 antibody, normal Jo 1 antibody, centromere antibody, normal histone antibody   Spondylosis of cervical spine 12/19/2016   CT: Multilevel spondylosis with mild to moderate canal stenosis at C5-C6.   Vitamin B deficiency 09/08/2015   Vitamin D  deficiency 09/08/2015   Past Surgical History:  Procedure Laterality Date   DILATION AND CURETTAGE OF UTERUS     ENDOMETRIAL ABLATION  05/2006   ESOPHAGOGASTRODUODENOSCOPY  01/18/2016   Out esophagitis.  Meprazole 20 mg daily recommended.   TUBAL LIGATION  2006   Patient Active Problem List   Diagnosis Date Noted   Orthostatic hypotension 11/18/2023   Cervical cancer screening 11/18/2023   Mixed hyperlipidemia 11/18/2023   Carpal tunnel syndrome of right wrist 11/14/2022   Stress incontinence 04/19/2021   Perimenopause 04/19/2021   Hot  flashes 04/19/2021   Cervical radiculopathy 06/03/2020   Cervical stenosis of spine 06/03/2020   Dysmenorrhea 01/19/2020   DUB (dysfunctional uterine bleeding) 08/30/2019   Numbness and tingling of both upper extremities 08/30/2019   Numbness and tingling of both lower extremities 08/30/2019   Night sweats 08/30/2019   Vertigo 08/30/2019   Abnormal mammogram of left breast 04/16/2019   Hypertriglyceridemia 02/12/2019   Overweight (BMI 25.0-29.9) 01/29/2019    PCP: Alvia Corean CROME, FNP  REFERRING PROVIDER: Cleatus Moccasin, MD   REFERRING DIAG: N39.3 (ICD-10-CM) - SUI (stress urinary incontinence, female)   THERAPY DIAG:  Muscle weakness (generalized)  Other lack of coordination  Pelvic pain  Rationale for Evaluation and Treatment: Rehabilitation  ONSET DATE: 2020  SUBJECTIVE:  SUBJECTIVE STATEMENT: Urinary leakage started 5 years ago . Fluid intake: water, 2 cups of coffee with one in AM and other in  afternoon  PAIN:  Are you having pain? Yes NPRS scale: 5/10 Pain location: lower abdomen  Pain type: aching Pain description: intermittent   Aggravating factors: having a bowel movement Relieving factors: not having a bowel movement  PRECAUTIONS: None  RED FLAGS: None   WEIGHT BEARING RESTRICTIONS: No  FALLS:  Has patient fallen in last 6 months? No  OCCUPATION: quality technician, paperwork  ACTIVITY LEVEL : walking 3 miles per day, aerobics  PLOF: Independent  PATIENT GOALS: reduce the urinary leakage  PERTINENT HISTORY:  Endometrial ablation 05/2006; Endometriosis;  Sexual abuse: No  BOWEL MOVEMENT: Pain with bowel movement: Yes Type of bowel movement:Type (Bristol Stool Scale) Type 4, Frequency every 2-3 days, Strain sometimes, and Splinting when having  pain Fully empty rectum: No, not all the time because has the urgency and a little bit comes out Leakage: No Fiber supplement/laxative No  URINATION: Pain with urination: No Fully empty bladder: Yes: sometimes due to having the sensation to go back to urinate Stream: Strong Urgency: Yes  Frequency: day every 45 minutes, night is 3-4 times,  Leakage: Coughing, Sneezing, and Laughing, get up from a chair when bladder is full, exercise Pads: Yes: 3-4 per day  INTERCOURSE:  Ability to have vaginal penetration Yes  Pain with intercourse: Deep Penetration DrynessYes  Climax: sometimes Marinoff Scale: 2/3 Uses lubrication  PREGNANCY: Vaginal deliveries 2 Tearing Yes:    PROLAPSE: None   OBJECTIVE:  Note: Objective measures were completed at Evaluation unless otherwise noted.  DIAGNOSTIC FINDINGS:  none  PATIENT SURVEYS:  PFIQ-7: 6 UIQ-7: 10  COGNITION: Overall cognitive status: Within functional limits for tasks assessed     SENSATION: Light touch: Appears intact   FUNCTIONAL TESTS:  Squat with difficulty with knee pain    POSTURE: rounded shoulders and forward head   LUMBARAROM/PROM:Lumbar ROM is full   LOWER EXTREMITY MNF:apojuzmjo hip ROM is full   LOWER EXTREMITY FFU:Apojuzmjo hip strength is 5/5  PALPATION: Abdominal: tenderness located on the lower abdomen with fascial tightness                External Perineal Exam: intact                             Internal Pelvic Floor: Palpable tenderness located on the levator ani, left side of the cervix and right side of the bladder; tightness along the left side of the cervix, around the fallopian tube and right ovary  Patient confirms identification and approves PT to assess internal pelvic floor and treatment Yes No emotional/communication barriers or cognitive limitation. Patient is motivated to learn. Patient understands and agrees with treatment goals and plan. PT explains patient will be examined in  standing, sitting, and lying down to see how their muscles and joints work. When they are ready, they will be asked to remove their underwear so PT can examine their perineum. The patient is also given the option of providing their own chaperone as one is not provided in our facility. The patient also has the right and is explained the right to defer or refuse any part of the evaluation or treatment including the internal exam. With the patient's consent, PT will use one gloved finger to gently assess the muscles of the pelvic floor, seeing how well it contracts and relaxes and  if there is muscle symmetry. After, the patient will get dressed and PT and patient will discuss exam findings and plan of care. PT and patient discuss plan of care, schedule, attendance policy and HEP activities.   PELVIC MMT:   MMT eval  Vaginal 3/5 with slight circular hug  (Blank rows = not tested)        TONE: Average tone  PROLAPSE: None noted  TODAY'S TREATMENT:                                                                                                                              DATE: 02/12/24  EVAL Examination completed, findings reviewed, pt educated on POC, HEP, and female pelvic floor anatomy, reasoning with pelvic floor assessment internally with pt consent, and abdominal massage. Pt motivated to participate in PT and agreeable to attempt recommendations.       PATIENT EDUCATION:  02/12/24 Education details: Access Code: [email or web address] Person educated: Patient Education method: Explanation, Demonstration, Tactile cues, Verbal cues, and Handouts Education comprehension: verbalized understanding, returned demonstration, verbal cues required, tactile cues required, and needs further education  HOME EXERCISE PROGRAM: 02/12/24 Access Code: [email or web address] URL: [email or web address] Date: 02/12/2024 Prepared by: Channing Pereyra  Exercises - Supine Diaphragmatic Breathing  - 1 x daily - 7 x weekly - 1  sets - 10 reps - Seated Happy Baby With Trunk Flexion For Pelvic Relaxation  - 1 x daily - 7 x weekly - 1 sets - 1 reps - 0 sec hold - Seated Piriformis Stretch with Trunk Bend  - 1 x daily - 7 x weekly - 1 sets - 2 reps - 30 sec hold  ASSESSMENT:  CLINICAL IMPRESSION: Patient is a 48 y.o. female who was seen today for physical therapy evaluation and treatment for stress urinary incontinence. Patient reports lower abdominal pain prior to a bowel movement is 5/10. She has to splint or strain to have a bowel movement. She may get the urge to have a bowel movement but a small amount comes out. Patient will urinate then have to go back to finish urinating.  Patient will wake up 3-4 times per night and during the day urinate every 45 minutes. She leaks urine with coughing, sneezing, and laughing, get up from a chair when bladder is full, exercise. She will ear 4-5 pads per day. She has tenderness located in the lower abdominal area, along the left side of the uterus, right side of bladder, bilateral levator ani. Patient has pain with vaginal deep penetration. Patient will benefit from skilled therapy to reduce pain, improve coordination of the pelvic floor to reduce urinary leakage.   OBJECTIVE IMPAIRMENTS: decreased activity tolerance, decreased coordination, decreased strength, increased fascial restrictions, increased muscle spasms, and pain.   ACTIVITY LIMITATIONS: sitting, transfers, continence, toileting, and locomotion level  PARTICIPATION LIMITATIONS: cleaning, laundry, interpersonal relationship, driving, shopping, community activity, and occupation  PERSONAL FACTORS: Fitness and 1-2 comorbidities: Endometrial ablation 05/2006; Endometriosis;  are also  affecting patient's functional outcome.   REHAB POTENTIAL: Excellent  CLINICAL DECISION MAKING: Evolving/moderate complexity  EVALUATION COMPLEXITY: Moderate   GOALS: Goals reviewed with patient? Yes  SHORT TERM GOALS: Target date:  03/11/24  Patient independent with initial HEP for manual work to pelvic floor, stretches and diaphragmatic breathing.  Baseline: Goal status: INITIAL  2.  Patient educated on the urge to void to reduce urgency.  Baseline:  Goal status: INITIAL  3.  Patient educated on lower abdominal massage to reduce pain.  Baseline:  Goal status: INITIAL   LONG TERM GOALS: Target date: 07/14/24  Patient independent with advanced HEP fro continence.  Baseline:  Goal status: INITIAL  2.  Patient is wearing 1 or less pads due to reduction of urinary leakage and possible skin break down.  Baseline:  Goal status: INITIAL  3.  Patient is able to use correct toilieting and breathing techniques to reduce her pain level to </= 1/10 pain. level Baseline:  Goal status: INITIAL  4.  Patient is waking up 1-2 times per night due to reduction of urge to void so she is able to increase her sleep to perform her job and daily tasks.  Baseline:  Goal status: INITIAL  5.  Patient is able to have deep vaginal penetration with pain level </= 1/10 due to reduction of trigger points and mobility of tissue.  Baseline:  Goal status: INITIAL   PLAN:  PT FREQUENCY: 1-2x/week  PT DURATION: other: 5 months  PLANNED INTERVENTIONS: 97110-Therapeutic exercises, 97530- Therapeutic activity, 97112- Neuromuscular re-education, 97535- Self Care, 02859- Manual therapy, [id number]- Electrical stimulation (unattended), 20560 (1-2 muscles), 20561 (3+ muscles)- Dry Needling, Patient/Family education, Taping, Joint mobilization, Spinal mobilization, Cryotherapy, Moist heat, and Biofeedback  PLAN FOR NEXT SESSION: manual work to lower abdomen, manual work around the cervix, fallopian tubes, ovary and levator ani, diaphragmatic breathing, urge to void   Channing Pereyra, PT 02/13/24 9:21 AM  PHYSICAL THERAPY DISCHARGE SUMMARY  Visits from Start of Care: 1  Current functional level related to goals / functional outcomes: See above.  Patient called and requested to be discharged due to the finances.    Remaining deficits: See above.    Education / Equipment: HEP   Patient agrees to discharge. Patient goals were not met. Patient is being discharged due to financial reasons. Thank you for the referral.   Channing Pereyra, PT 03/21/24 8:00 AM

## 2024-02-12 ENCOUNTER — Other Ambulatory Visit: Payer: Self-pay

## 2024-02-12 ENCOUNTER — Encounter: Payer: Self-pay | Admitting: Physical Therapy

## 2024-02-12 ENCOUNTER — Ambulatory Visit: Attending: Obstetrics and Gynecology | Admitting: Physical Therapy

## 2024-02-12 DIAGNOSIS — M6281 Muscle weakness (generalized): Secondary | ICD-10-CM | POA: Insufficient documentation

## 2024-02-12 DIAGNOSIS — R278 Other lack of coordination: Secondary | ICD-10-CM | POA: Insufficient documentation

## 2024-02-12 DIAGNOSIS — N393 Stress incontinence (female) (male): Secondary | ICD-10-CM | POA: Diagnosis not present

## 2024-02-12 DIAGNOSIS — R102 Pelvic and perineal pain: Secondary | ICD-10-CM | POA: Diagnosis present

## 2024-02-16 ENCOUNTER — Ambulatory Visit: Admitting: Family Medicine

## 2024-03-29 ENCOUNTER — Other Ambulatory Visit: Payer: Self-pay | Admitting: Medical Genetics

## 2024-04-05 ENCOUNTER — Other Ambulatory Visit (HOSPITAL_COMMUNITY)
Admission: RE | Admit: 2024-04-05 | Discharge: 2024-04-05 | Disposition: A | Discharge status: Home / Self Care | Source: Other Acute Inpatient Hospital | Attending: Obstetrics and Gynecology | Admitting: Obstetrics and Gynecology

## 2024-04-05 ENCOUNTER — Ambulatory Visit: Admitting: Obstetrics and Gynecology

## 2024-04-05 ENCOUNTER — Encounter: Payer: Self-pay | Admitting: Obstetrics and Gynecology

## 2024-04-05 VITALS — BP 106/69 | HR 69 | Ht 58.7 in | Wt 154.4 lb

## 2024-04-05 DIAGNOSIS — R319 Hematuria, unspecified: Secondary | ICD-10-CM | POA: Insufficient documentation

## 2024-04-05 DIAGNOSIS — N393 Stress incontinence (female) (male): Secondary | ICD-10-CM

## 2024-04-05 DIAGNOSIS — K5904 Chronic idiopathic constipation: Secondary | ICD-10-CM | POA: Diagnosis not present

## 2024-04-05 DIAGNOSIS — N952 Postmenopausal atrophic vaginitis: Secondary | ICD-10-CM | POA: Diagnosis not present

## 2024-04-05 DIAGNOSIS — R82998 Other abnormal findings in urine: Secondary | ICD-10-CM

## 2024-04-05 DIAGNOSIS — N3281 Overactive bladder: Secondary | ICD-10-CM

## 2024-04-05 DIAGNOSIS — R350 Frequency of micturition: Secondary | ICD-10-CM

## 2024-04-05 LAB — POCT URINALYSIS DIP (CLINITEK)
Bilirubin, UA: NEGATIVE
Glucose, UA: NEGATIVE mg/dL
Ketones, POC UA: NEGATIVE mg/dL
Nitrite, UA: NEGATIVE
POC PROTEIN,UA: NEGATIVE
Spec Grav, UA: >=1.03 — AB (ref 1.010–1.025)
Urobilinogen, UA: 0.2 U/dL
pH, UA: 6 (ref 5.0–8.0)

## 2024-04-05 MED ORDER — ESTRADIOL 0.1 MG/GM VA CREA: TOPICAL_CREAM | VAGINAL | 11 refills | Status: AC

## 2024-04-05 NOTE — Patient Instructions (Addendum)
 For treatment of stress urinary incontinence, which is leakage with physical activity/movement/strainging/coughing, we discussed expectant management versus nonsurgical options versus surgery. Nonsurgical options include weight loss, physical therapy, as well as a pessary.  Surgical options include a midurethral sling, which is a synthetic mesh sling that acts like a hammock under the urethra to prevent leakage of urine, and transurethral injection of a bulking agent. You have decided to proceed with a sling. The surgery scheduler will reach out to you once benefits are confirmed.   Today we talked about ways to manage bladder urgency such as altering your diet to avoid irritative beverages and foods (bladder diet) as well as attempting to decrease stress and other exacerbating factors.    The Most Bothersome Foods* The Least Bothersome Foods*  Coffee - Regular & Decaf Tea - caffeinated Carbonated beverages - cola, non-colas, diet & caffeine-free Alcohols - Beer, Red Wine, White Wine, [address] - Grapefruit, Reserve, Orange, [employer] - Cranberry, Grapefruit, Orange, Pineapple Vegetables - Tomato & Tomato Products Flavor Enhancers - Hot peppers, Spicy foods, Chili, Horseradish, Vinegar, Monosodium glutamate (MSG) Artificial Sweeteners - NutraSweet, Sweet 'N Low, Equal (sweetener), Saccharin Ethnic foods - [place], [place], [place] food [employer] - low-fat & whole Fruits - Bananas, Blueberries, Honeydew melon, Pears, Raisins, Watermelon Vegetables - Broccoli, [address] Sprouts, [place], Carrots, Cauliflower, [place], Cucumber, Mushrooms, Peas, Radishes, Squash, Zucchini, White potatoes, Sweet potatoes & yams Poultry - Chicken, Eggs, [place], [employer] - Beef, Diplomatic Services operational officer, Lamb Seafood - Shrimp, [place] fish, Salmon Grains - Oat, Rice Snacks - Pretzels, Popcorn  *Mitch ALF et al. Diet and its role in interstitial cystitis/bladder pain syndrome (IC/BPS) and comorbid conditions. BJU  International. BJU Int. 2012 Jan 11.    Constipation: Our goal is to achieve formed bowel movements daily or every-other-day.  You may need to try different combinations of the following options to find what works best for you - everybody's body works differently so feel free to adjust the dosages as needed.  Some options to help maintain bowel health include:  Dietary changes (more leafy greens, vegetables and fruits; less processed foods) Fiber supplementation (Benefiber, FiberCon, Metamucil or Psyllium). Start slow and increase gradually to full dose. Take fiber daily.  Over-the-counter agents such as: stool softeners (Docusate or Colace) and/or laxatives (Miralax, milk of magnesia)  Power Pudding is a natural mixture that may help your constipation.  To make blend 1 cup applesauce, 1 cup wheat bran, and 3/4 cup prune juice, refrigerate and then take 1 tablespoon daily with a large glass of water as needed.   Vulvovaginal moisturizer Options: Vitamin E oil (pump or capsule) or cream (Gene's Vit E Cream) Coconut oil Silicone-based lubricant for use during intercourse (wet platinum is a brand available at most drugstores) Crisco Consider the ingredients of the product - the fewer the ingredients the better!  Directions for Use: Clean and dry your hands Gently dab the vulvar/vaginal area dry as needed Apply a "pea-sized" amount of the moisturizer onto your fingertip Using you other hand, open the labia  Apply the moisturizer to the vulvar/vaginal tissues Wear loose fitting underwear/clothing if possible following application Use moisturize up to 3 times daily as desired.   Start vaginal estrogen therapy nightly for two weeks then 2 times weekly at night for treatment of vaginal atrophy (dryness of the vaginal tissues).  Please let us  know if the prescription is too expensive and we can look for alternative options.

## 2024-04-05 NOTE — Assessment & Plan Note (Signed)
-   We discussed the symptoms of overactive bladder (OAB), which include urinary urgency, urinary frequency, nocturia, with or without urge incontinence.  While we do not know the exact etiology of OAB, several treatment options exist. We discussed management including behavioral therapy (decreasing bladder irritants, urge suppression strategies, timed voids, bladder retraining), physical therapy, medication.  - She will work on avoiding caffeine and bladder retraining

## 2024-04-05 NOTE — Assessment & Plan Note (Signed)
-   For constipation, we reviewed the importance of a better bowel regimen.  We also discussed the importance of avoiding chronic straining, as it can exacerbate her pelvic floor symptoms; we discussed treating constipation and straining prior to surgery, as postoperative straining can lead to damage to the repair and recurrence of symptoms. We discussed initiating therapy with increasing fluid intake and daily fiber supplementation. Can also add miralax as needed

## 2024-04-05 NOTE — Assessment & Plan Note (Signed)
-   For symptomatic vaginal atrophy options include lubrication with a water-based lubricant, personal hygiene measures and barrier protection against wetness, and estrogen replacement in the form of vaginal cream, vaginal tablets, or a time-released vaginal ring.   - Prescribed estradiol  cream. Recommended coconut oil for dryness and lubricant

## 2024-04-05 NOTE — Assessment & Plan Note (Signed)
 For treatment of stress urinary incontinence,  non-surgical options include expectant management, weight loss, physical therapy, as well as a pessary.  Surgical options include a midurethral sling, Burch urethropexy, and transurethral injection of a bulking agent. - She is interested in a sling.   Plan for surgery: Exam under anesthesia, midurethral sling, cystoscopy  General Surgical Risks: For all procedures, there are risks of bleeding, infection, damage to surrounding organs including but not limited to bowel, bladder, blood vessels, ureters and nerves, and need for further surgery if an injury were to occur. These risks are all low with minimally invasive surgery.   There are risks of numbness and weakness at any body site or buttock/rectal pain.  It is possible that baseline pain can be worsened by surgery, either with or without mesh. If surgery is vaginal, there is also a low risk of possible conversion to laparoscopy or open abdominal incision where indicated. Very rare risks include blood transfusion, blood clot, heart attack, pneumonia, or death.   There is also a risk of short-term postoperative urinary retention with need to use a catheter. About half of patients need to go home from surgery with a catheter, which is then later removed in the office. The risk of long-term need for a catheter is very low. There is also a risk of worsening of overactive bladder.   Sling: The effectiveness of a midurethral vaginal mesh sling is approximately 85%, and thus, there will be times when you may leak urine after surgery, especially if your bladder is full or if you have a strong cough. There is a balance between making the sling tight enough to treat your leakage but not too tight so that you have long-term difficulty emptying your bladder. A mesh sling will not directly treat overactive bladder/urge incontinence and may worsen it.  There is an FDA safety notification on vaginal mesh procedures for  prolapse but NOT mesh slings. We have extensive experience and training with mesh placement and we have close postoperative follow up to identify any potential complications from mesh. It is important to realize that this mesh is a permanent implant that cannot be easily removed. There are rare risks of mesh exposure (2-4%), pain with intercourse (0-7%), and infection (<1%). The risk of mesh exposure if more likely in a woman with risks for poor healing (prior radiation, poorly controlled diabetes, or immunocompromised). The risk of new or worsened chronic pain after mesh implant is more common in women with baseline chronic pain and/or poorly controlled anxiety or depression. Approximately 2-4% of patients will experience longer-term post-operative voiding dysfunction that may require surgical revision of the sling. We also reviewed that postoperatively, her stream may not be as strong as before surgery.    - Medical clearance: not required  - Anticoagulant use: No - Medicaid Hysterectomy form: n/a - Accepts blood transfusion: yes - Expected length of stay: outpatient  Request sent for surgery scheduling.

## 2024-04-05 NOTE — Progress Notes (Signed)
 New Patient Evaluation and Consultation  Referring Provider: Cleatus Moccasin, MD PCP: Alvia Corean CROME, FNP Date of Service: 04/05/2024  SUBJECTIVE Chief Complaint: New Patient (Initial Visit) (Kelly Duncan is a 48 y.o. female is here for urinary incontinence.)  History of Present Illness: Kelly Duncan is a 48 y.o.  female seen in consultation at the request of Dr Cleatus for evaluation of incontinence.     Urinary Symptoms: Leaks urine with cough/ sneeze, laughing, exercise, lifting, going from sitting to standing, and with a full bladder Leaks several time(s) per day. Usually SUI, only two times has had urgency leakage.  Pad use: multiple pads  Patient is bothered by UI symptoms.  Day time voids- every 30-45 min.  Nocturia: 2-3 times per night to void. She urinates frequently to help prevent leaking.  Voiding dysfunction:  empties bladder well.  Patient does not use a catheter to empty bladder.  When urinating, patient feels she has no difficulties Drinks: 1 cup coffee in AM, another in the afternoon, 5 glasses water per day. Does not drink much while at work to prevent accidents.   UTIs: 0 UTI's in the last year.   Denies history of blood in urine and kidney or bladder stones  Pelvic Organ Prolapse Symptoms:                  Patient Denies a feeling of a bulge the vaginal area.   Bowel Symptom: Bowel movements: has small amount every day, but full bowel movements 2-3 times a week Stool consistency: hard Straining: yes.  Splinting: no.  Incomplete evacuation: yes.  Patient Denies accidental bowel leakage / fecal incontinence Bowel regimen: diet  HM Colonoscopy          Upcoming     Colonoscopy (Every 5 Years) Next due on 11/02/2028    11/03/2023  COLONOSCOPY   Only the first 1 history entries have been loaded, but more history exists.                Sexual Function Sexually active: yes.  Sexual orientation: Straight Pain with sex: Yes, deep  in the pelvis, has discomfort due to dryness Has had some itching and burning with lubrication. Has not used vaginal estrogen.   Pelvic Pain Denies pelvic pain Has a history of endometriosis   Past Medical History:  Past Medical History:  Diagnosis Date   Cervical radiculopathy at C7 2017   Chicken pox    Constipation    De Quervain's tenosynovitis, right 02/12/2019   Dysmenorrhea    Endometriosis    Frequent headaches    Ganglion cyst 06/03/2020   Ganglion cyst of finger of right hand 11/22/2014   Hepatitis A    childhood   Hyperlipidemia    Left cervical lymphadenopathy 09/18/2015   benign appearing US    Low blood pressure    Plantar fasciitis    Positive ANA (antinuclear antibody) 03/02/2015   Negative anti--DNA screen, negative intrinsic factor antibody, normal sed rate, normal CRP, normal SCL-70 antibody, normal Jo 1 antibody, centromere antibody, normal histone antibody   Spondylosis of cervical spine 12/19/2016   CT: Multilevel spondylosis with mild to moderate canal stenosis at C5-C6.   Vitamin B deficiency 09/08/2015   Vitamin D  deficiency 09/08/2015     Past Surgical History:   Past Surgical History:  Procedure Laterality Date   DILATION AND CURETTAGE OF UTERUS     ENDOMETRIAL ABLATION  05/2006   ESOPHAGOGASTRODUODENOSCOPY  01/18/2016   Out esophagitis.  Meprazole 20 mg daily recommended.   TUBAL LIGATION  2006     Past OB/GYN History: OB History  Gravida Para Term Preterm AB Living  3 2 2  1 2   SAB IAB Ectopic Multiple Live Births  1        # Outcome Date GA Lbr Len/2nd Weight Sex Type Anes PTL Lv  3 SAB           2 Term      Vag-Spont     1 Term      Vag-Spont       Menopausal: Yes, at age 20, Denies vaginal bleeding since menopause Contraception: tubal ligation     Component Value Date/Time   DIAGPAP (A) 12/13/2023 0954    - Atypical squamous cells of undetermined significance (ASC-US )   DIAGPAP  12/26/2017 0000    NEGATIVE FOR  INTRAEPITHELIAL LESIONS OR MALIGNANCY.   HPVHIGH Negative 12/13/2023 0954   ADEQPAP  12/13/2023 0954    Satisfactory for evaluation; transformation zone component PRESENT.   ADEQPAP  12/26/2017 0000    Satisfactory for evaluation  endocervical/transformation zone component PRESENT.    Medications: Patient has a current medication list which includes the following prescription(s): [START ON 04/08/2024] estradiol , estradiol , fenofibrate , ibuprofen, meloxicam , midodrine , and progesterone .   Allergies: Patient is allergic to propofol.   Social History:  Social History   Tobacco Use   Smoking status: Never   Smokeless tobacco: Never  Vaping Use   Vaping status: Never Used  Substance Use Topics   Alcohol use: Never   Drug use: Never    Relationship status: married Patient lives with husband and children.   Patient is employed as a Regulatory affairs officer. Regular exercise: Yes: walking on treadmill 2+ miles per day History of abuse: No  Family History:   Family History  Problem Relation Age of Onset   Arthritis Maternal Grandmother    Diabetes Maternal Grandmother    Hyperlipidemia Maternal Grandmother    Hypertension Maternal Grandmother    Stroke Maternal Grandmother    Varicose Veins Maternal Grandmother    Diabetes Maternal Grandfather    Hyperlipidemia Father    Prostate cancer Father    Hearing loss Father    Hypertension Father    Heart disease Father    Cancer Father    Hypertension Mother    Hyperlipidemia Mother    Thyroid disease Mother    Arthritis Mother    Diabetes Mother    Stroke Mother    Hyperlipidemia Sister    Diabetes Sister    Hypertension Sister    Obesity Sister    Hyperlipidemia Sister    Depression Sister    Diabetes Sister    Hypertension Sister    Breast cancer Paternal Aunt    Breast cancer Cousin    Colon cancer Other    Colon polyps Other      Review of Systems: Review of Systems  Constitutional:  Negative for fever,  malaise/fatigue and weight loss.  Respiratory:  Negative for cough, shortness of breath and wheezing.   Cardiovascular:  Negative for chest pain, palpitations and leg swelling.  Gastrointestinal:  Negative for abdominal pain and blood in stool.  Genitourinary:  Negative for dysuria.  Musculoskeletal:  Negative for myalgias.  Skin:  Negative for rash.  Neurological:  Negative for dizziness and headaches.  Endo/Heme/Allergies:  Does not bruise/bleed easily.  Psychiatric/Behavioral:  Negative for depression. The patient is not nervous/anxious.      OBJECTIVE Physical Exam: Vitals:  04/05/24 1507  BP: 106/69  Pulse: 69  Weight: 154 lb 6.4 oz (70 kg)  Height: 4' 10.7 (1.491 m)    Physical Exam Vitals reviewed. Exam conducted with a chaperone present.  Constitutional:      General: She is not in acute distress. Pulmonary:     Effort: Pulmonary effort is normal.  Abdominal:     General: There is no distension.     Palpations: Abdomen is soft.     Tenderness: There is no abdominal tenderness. There is no rebound.  Musculoskeletal:        General: No swelling. Normal range of motion.  Skin:    General: Skin is warm and dry.     Findings: No rash.  Neurological:     Mental Status: She is alert and oriented to person, place, and time.  Psychiatric:        Mood and Affect: Mood normal.        Behavior: Behavior normal.      GU / Detailed Urogynecologic Evaluation:  Pelvic Exam: Normal external female genitalia; Bartholin's and Skene's glands normal in appearance; urethral meatus normal in appearance, no urethral masses or discharge.    Speculum exam reveals normal vaginal mucosa with atrophy. Cervix normal appearance. Uterus normal single, nontender. Adnexa no mass, fullness, tenderness.     Pelvic floor strength I/V, puborectalis III/V external anal sphincter IV/V  Pelvic floor musculature: Right levator non-tender, Right obturator tender, Left levator non-tender, Left  obturator tender  POP-Q:   POP-Q  -3                                            Aa   -3                                           Ba  -9                                              C   3.5                                            Gh  5                                            Pb  10                                            tvl   -2                                            Ap  2  Bp  -10                                              D      Rectal Exam:  Normal sphincter tone, no distal rectocele, enterocoele not present, no rectal masses, no sign of dyssynergia when asking the patient to bear down.  Post-Void Residual (PVR) by Bladder Scan: In order to evaluate bladder emptying, we discussed obtaining a postvoid residual and patient agreed to this procedure.  Procedure: The ultrasound unit was placed on the patient's abdomen in the suprapubic region after the patient had voided.    Post Void Residual - 04/05/24 1518       Post Void Residual   Post Void Residual 56 mL         Verbal consent was obtained to perform simple CMG procedure:   Prolapse was reduced using 2 large cotton swabs. Urethra was prepped with betadine and a 74F catheter was placed and bladder was drained completely. The bladder was then backfilled with sterile water by gravity.  First sensation: 80ml First Desire: Strong Desire: Capacity: Small DO noted with filling. Cough stress test was positive in the standing position. Valsalva stress test was not done.  Catheter was replaced to drain bladder.   Interpretation: CMG showed increased sensation, and normal cystometric capacity. Findings positive for stress incontinence, positive for detrusor overactivity.    Laboratory Results: Lab Results  Component Value Date   COLORU yellow 04/05/2024   CLARITYU clear 04/05/2024   GLUCOSEUR negative 04/05/2024   BILIRUBINUR  negative 04/05/2024   SPECGRAV >=1.030 (A) 04/05/2024   RBCUR trace-intact (A) 04/05/2024   PHUR 6.0 04/05/2024   PROTEINUR negative 04/30/2022   UROBILINOGEN 0.2 04/05/2024   LEUKOCYTESUR Small (1+) (A) 04/05/2024    Lab Results  Component Value Date   CREATININE 0.82 08/21/2023   CREATININE 0.78 11/14/2022   CREATININE 0.78 08/28/2019    Lab Results  Component Value Date   HGBA1C 5.3 11/14/2022    Lab Results  Component Value Date   HGB 14.0 08/21/2023     ASSESSMENT AND PLAN Ms. Wortley is a 48 y.o. with:  1. SUI (stress urinary incontinence, female)   2. Overactive bladder   3. Vaginal atrophy   4. Chronic idiopathic constipation   5. Urinary frequency   6. Leukocytes in urine   7. Hematuria, unspecified type     SUI (stress urinary incontinence, female) Assessment & Plan: For treatment of stress urinary incontinence,  non-surgical options include expectant management, weight loss, physical therapy, as well as a pessary.  Surgical options include a midurethral sling, Burch urethropexy, and transurethral injection of a bulking agent. - She is interested in a sling.   Plan for surgery: Exam under anesthesia, midurethral sling, cystoscopy  General Surgical Risks: For all procedures, there are risks of bleeding, infection, damage to surrounding organs including but not limited to bowel, bladder, blood vessels, ureters and nerves, and need for further surgery if an injury were to occur. These risks are all low with minimally invasive surgery.   There are risks of numbness and weakness at any body site or buttock/rectal pain.  It is possible that baseline pain can be worsened by surgery, either with or without mesh. If surgery is vaginal, there is also a low risk of possible conversion to laparoscopy or open  abdominal incision where indicated. Very rare risks include blood transfusion, blood clot, heart attack, pneumonia, or death.   There is also a risk of short-term  postoperative urinary retention with need to use a catheter. About half of patients need to go home from surgery with a catheter, which is then later removed in the office. The risk of long-term need for a catheter is very low. There is also a risk of worsening of overactive bladder.   Sling: The effectiveness of a midurethral vaginal mesh sling is approximately 85%, and thus, there will be times when you may leak urine after surgery, especially if your bladder is full or if you have a strong cough. There is a balance between making the sling tight enough to treat your leakage but not too tight so that you have long-term difficulty emptying your bladder. A mesh sling will not directly treat overactive bladder/urge incontinence and may worsen it.  There is an FDA safety notification on vaginal mesh procedures for prolapse but NOT mesh slings. We have extensive experience and training with mesh placement and we have close postoperative follow up to identify any potential complications from mesh. It is important to realize that this mesh is a permanent implant that cannot be easily removed. There are rare risks of mesh exposure (2-4%), pain with intercourse (0-7%), and infection (<1%). The risk of mesh exposure if more likely in a woman with risks for poor healing (prior radiation, poorly controlled diabetes, or immunocompromised). The risk of new or worsened chronic pain after mesh implant is more common in women with baseline chronic pain and/or poorly controlled anxiety or depression. Approximately 2-4% of patients will experience longer-term post-operative voiding dysfunction that may require surgical revision of the sling. We also reviewed that postoperatively, her stream may not be as strong as before surgery.    - Medical clearance: not required  - Anticoagulant use: No - Medicaid Hysterectomy form: n/a - Accepts blood transfusion: yes - Expected length of stay: outpatient  Request sent for surgery  scheduling.      Overactive bladder Assessment & Plan: - We discussed the symptoms of overactive bladder (OAB), which include urinary urgency, urinary frequency, nocturia, with or without urge incontinence.  While we do not know the exact etiology of OAB, several treatment options exist. We discussed management including behavioral therapy (decreasing bladder irritants, urge suppression strategies, timed voids, bladder retraining), physical therapy, medication.  - She will work on avoiding caffeine and bladder retraining    Vaginal atrophy Assessment & Plan: - For symptomatic vaginal atrophy options include lubrication with a water-based lubricant, personal hygiene measures and barrier protection against wetness, and estrogen replacement in the form of vaginal cream, vaginal tablets, or a time-released vaginal ring.   - Prescribed estradiol  cream. Recommended coconut oil for dryness and lubricant  Orders: -     Estradiol ; Place 0.5g nightly for two weeks then twice a week after  Dispense: 30 g; Refill: 11  Chronic idiopathic constipation Assessment & Plan: - For constipation, we reviewed the importance of a better bowel regimen.  We also discussed the importance of avoiding chronic straining, as it can exacerbate her pelvic floor symptoms; we discussed treating constipation and straining prior to surgery, as postoperative straining can lead to damage to the repair and recurrence of symptoms. We discussed initiating therapy with increasing fluid intake and daily fiber supplementation. Can also add miralax as needed    Urinary frequency -     POCT URINALYSIS DIP (CLINITEK) -  POCT URINALYSIS DIP (CLINITEK)  Leukocytes in urine -     Urine Microscopic; Future  Hematuria, unspecified type -     Urine Culture; Future  Return for preop   Kelly LOISE Caper, MD

## 2024-04-07 LAB — URINE CULTURE

## 2024-04-11 ENCOUNTER — Other Ambulatory Visit: Payer: Self-pay | Admitting: Family Medicine

## 2024-04-11 ENCOUNTER — Ambulatory Visit: Admitting: Family Medicine

## 2024-04-11 DIAGNOSIS — I951 Orthostatic hypotension: Secondary | ICD-10-CM

## 2024-04-11 DIAGNOSIS — R42 Dizziness and giddiness: Secondary | ICD-10-CM

## 2024-04-15 ENCOUNTER — Ambulatory Visit: Admitting: Physical Therapy

## 2024-04-16 ENCOUNTER — Encounter

## 2024-04-17 ENCOUNTER — Other Ambulatory Visit: Payer: Self-pay | Admitting: Family Medicine

## 2024-04-17 DIAGNOSIS — Z1231 Encounter for screening mammogram for malignant neoplasm of breast: Secondary | ICD-10-CM

## 2024-04-21 ENCOUNTER — Encounter: Payer: Self-pay | Admitting: Obstetrics and Gynecology

## 2024-04-22 ENCOUNTER — Encounter: Admitting: Physical Therapy

## 2024-04-29 ENCOUNTER — Encounter: Admitting: Physical Therapy

## 2024-05-03 ENCOUNTER — Ambulatory Visit: Admitting: Family Medicine

## 2024-05-06 ENCOUNTER — Encounter: Admitting: Physical Therapy

## 2024-05-13 ENCOUNTER — Encounter: Admitting: Physical Therapy

## 2024-05-17 ENCOUNTER — Ambulatory Visit
Admission: RE | Admit: 2024-05-17 | Discharge: 2024-05-17 | Disposition: A | Discharge status: Home / Self Care | Source: Ambulatory Visit

## 2024-05-17 DIAGNOSIS — Z1231 Encounter for screening mammogram for malignant neoplasm of breast: Secondary | ICD-10-CM

## 2024-05-22 ENCOUNTER — Other Ambulatory Visit: Payer: Self-pay | Admitting: Family Medicine

## 2024-05-22 DIAGNOSIS — R928 Other abnormal and inconclusive findings on diagnostic imaging of breast: Secondary | ICD-10-CM

## 2024-06-05 ENCOUNTER — Other Ambulatory Visit: Payer: Self-pay | Admitting: Family Medicine

## 2024-06-05 ENCOUNTER — Ambulatory Visit
Admission: RE | Admit: 2024-06-05 | Discharge: 2024-06-05 | Disposition: A | Source: Ambulatory Visit | Attending: Family Medicine

## 2024-06-05 DIAGNOSIS — R928 Other abnormal and inconclusive findings on diagnostic imaging of breast: Secondary | ICD-10-CM

## 2024-06-05 DIAGNOSIS — N63 Unspecified lump in unspecified breast: Secondary | ICD-10-CM

## 2024-06-10 ENCOUNTER — Inpatient Hospital Stay: Admission: RE | Admit: 2024-06-10 | Discharge: 2024-06-10 | Attending: Family Medicine

## 2024-06-10 ENCOUNTER — Ambulatory Visit
Admission: RE | Admit: 2024-06-10 | Discharge: 2024-06-10 | Disposition: A | Source: Ambulatory Visit | Attending: Family Medicine

## 2024-06-10 DIAGNOSIS — N63 Unspecified lump in unspecified breast: Secondary | ICD-10-CM

## 2024-06-10 DIAGNOSIS — R928 Other abnormal and inconclusive findings on diagnostic imaging of breast: Secondary | ICD-10-CM

## 2024-06-10 HISTORY — PX: BREAST BIOPSY: SHX20

## 2024-06-11 ENCOUNTER — Inpatient Hospital Stay: Admission: RE | Admit: 2024-06-11 | Source: Ambulatory Visit

## 2024-06-11 LAB — SURGICAL PATHOLOGY

## 2024-06-26 ENCOUNTER — Other Ambulatory Visit

## 2024-07-05 ENCOUNTER — Other Ambulatory Visit: Payer: Self-pay | Admitting: Medical Genetics

## 2024-07-05 DIAGNOSIS — Z006 Encounter for examination for normal comparison and control in clinical research program: Secondary | ICD-10-CM

## 2024-07-24 ENCOUNTER — Other Ambulatory Visit: Payer: Self-pay | Admitting: Family Medicine

## 2024-07-24 DIAGNOSIS — M4802 Spinal stenosis, cervical region: Secondary | ICD-10-CM

## 2024-07-25 ENCOUNTER — Other Ambulatory Visit

## 2024-07-28 LAB — GENECONNECT MOLECULAR SCREEN: Genetic Analysis Overall Interpretation: NEGATIVE

## 2024-08-14 ENCOUNTER — Encounter: Admitting: Physician Assistant

## 2024-08-20 ENCOUNTER — Encounter: Admitting: Obstetrics and Gynecology

## 2024-09-02 ENCOUNTER — Ambulatory Visit (HOSPITAL_COMMUNITY): Admit: 2024-09-02 | Admitting: Obstetrics and Gynecology

## 2024-10-10 ENCOUNTER — Encounter: Admitting: Obstetrics and Gynecology
